# Patient Record
Sex: Female | Born: 1970 | Race: White | Hispanic: No | Marital: Married | Smoking: Never smoker
Health system: Southern US, Community
[De-identification: ages and names within clinical notes are randomized; demographics above are authoritative.]

## PROBLEM LIST (undated history)

## (undated) DIAGNOSIS — Z309 Encounter for contraceptive management, unspecified: Secondary | ICD-10-CM

## (undated) DIAGNOSIS — K649 Unspecified hemorrhoids: Secondary | ICD-10-CM

## (undated) DIAGNOSIS — F419 Anxiety disorder, unspecified: Secondary | ICD-10-CM

## (undated) DIAGNOSIS — J329 Chronic sinusitis, unspecified: Secondary | ICD-10-CM

## (undated) DIAGNOSIS — E079 Disorder of thyroid, unspecified: Secondary | ICD-10-CM

## (undated) DIAGNOSIS — E039 Hypothyroidism, unspecified: Secondary | ICD-10-CM

## (undated) HISTORY — DX: Chronic sinusitis, unspecified: J32.9

## (undated) HISTORY — DX: Encounter for contraceptive management, unspecified: Z30.9

## (undated) HISTORY — DX: Unspecified hemorrhoids: K64.9

## (undated) HISTORY — DX: Anxiety disorder, unspecified: F41.9

---

## 1998-04-12 ENCOUNTER — Other Ambulatory Visit: Admission: RE | Admit: 1998-04-12 | Discharge: 1998-04-12 | Payer: Self-pay | Admitting: Obstetrics and Gynecology

## 1999-04-25 ENCOUNTER — Other Ambulatory Visit: Admission: RE | Admit: 1999-04-25 | Discharge: 1999-04-25 | Payer: Self-pay | Admitting: Obstetrics and Gynecology

## 2000-11-07 ENCOUNTER — Other Ambulatory Visit: Admission: RE | Admit: 2000-11-07 | Discharge: 2000-11-07 | Payer: Self-pay | Admitting: *Deleted

## 2001-03-18 ENCOUNTER — Encounter: Admission: RE | Admit: 2001-03-18 | Discharge: 2001-06-03 | Payer: Self-pay | Admitting: Gynecology

## 2001-06-01 ENCOUNTER — Inpatient Hospital Stay (HOSPITAL_COMMUNITY): Admission: AD | Admit: 2001-06-01 | Discharge: 2001-06-04 | Payer: Self-pay | Admitting: *Deleted

## 2001-06-01 ENCOUNTER — Encounter (INDEPENDENT_AMBULATORY_CARE_PROVIDER_SITE_OTHER): Payer: Self-pay | Admitting: Specialist

## 2001-07-08 ENCOUNTER — Other Ambulatory Visit: Admission: RE | Admit: 2001-07-08 | Discharge: 2001-07-08 | Payer: Self-pay | Admitting: *Deleted

## 2002-07-14 ENCOUNTER — Other Ambulatory Visit: Admission: RE | Admit: 2002-07-14 | Discharge: 2002-07-14 | Payer: Self-pay | Admitting: *Deleted

## 2003-07-18 ENCOUNTER — Other Ambulatory Visit: Admission: RE | Admit: 2003-07-18 | Discharge: 2003-07-18 | Payer: Self-pay | Admitting: Gynecology

## 2004-07-18 ENCOUNTER — Other Ambulatory Visit: Admission: RE | Admit: 2004-07-18 | Discharge: 2004-07-18 | Payer: Self-pay | Admitting: Gynecology

## 2005-08-23 ENCOUNTER — Other Ambulatory Visit: Admission: RE | Admit: 2005-08-23 | Discharge: 2005-08-23 | Payer: Self-pay | Admitting: Gynecology

## 2006-08-09 ENCOUNTER — Emergency Department (HOSPITAL_COMMUNITY): Admission: EM | Admit: 2006-08-09 | Discharge: 2006-08-09 | Payer: Self-pay | Admitting: Family Medicine

## 2006-08-29 ENCOUNTER — Other Ambulatory Visit: Admission: RE | Admit: 2006-08-29 | Discharge: 2006-08-29 | Payer: Self-pay | Admitting: Gynecology

## 2007-07-11 ENCOUNTER — Inpatient Hospital Stay (HOSPITAL_COMMUNITY): Admission: RE | Admit: 2007-07-11 | Discharge: 2007-07-13 | Payer: Self-pay | Admitting: Obstetrics and Gynecology

## 2007-09-17 ENCOUNTER — Other Ambulatory Visit: Admission: RE | Admit: 2007-09-17 | Discharge: 2007-09-17 | Payer: Self-pay | Admitting: Obstetrics and Gynecology

## 2009-01-04 ENCOUNTER — Other Ambulatory Visit: Admission: RE | Admit: 2009-01-04 | Discharge: 2009-01-04 | Payer: Self-pay | Admitting: Obstetrics and Gynecology

## 2010-04-24 ENCOUNTER — Other Ambulatory Visit: Admission: RE | Admit: 2010-04-24 | Discharge: 2010-04-24 | Payer: Self-pay | Admitting: Obstetrics and Gynecology

## 2010-12-13 ENCOUNTER — Other Ambulatory Visit: Payer: Self-pay | Admitting: Obstetrics and Gynecology

## 2010-12-13 ENCOUNTER — Other Ambulatory Visit (HOSPITAL_COMMUNITY)
Admission: RE | Admit: 2010-12-13 | Discharge: 2010-12-13 | Disposition: A | Discharge status: Home / Self Care | Payer: BC Managed Care – PPO | Source: Ambulatory Visit | Attending: Obstetrics and Gynecology | Admitting: Obstetrics and Gynecology

## 2010-12-13 DIAGNOSIS — Z113 Encounter for screening for infections with a predominantly sexual mode of transmission: Secondary | ICD-10-CM | POA: Insufficient documentation

## 2010-12-13 DIAGNOSIS — Z01419 Encounter for gynecological examination (general) (routine) without abnormal findings: Secondary | ICD-10-CM | POA: Insufficient documentation

## 2011-03-19 NOTE — H&P (Signed)
NAMEMAILE, LINFORD                ACCOUNT NO.:  [id number]   MEDICAL RECORD NO.:  [id number]          PATIENT TYPE:  INP   LOCATION:  NA                            [facility]  [facility]   PHYSICIAN:  Tilda Burrow, M.D. DATE OF BIRTH:  10-Dec-1970   DATE OF ADMISSION:  DATE OF DISCHARGE:                              HISTORY & PHYSICAL   HISTORY OF PRESENT ILLNESS:  This is a 40 year old female, gravida 4,  para 1, AB 2, due July 20, 2007 by menstrual history, September 14  by 21-week ultrasound.  She is admitted at 38 weeks 6 days for repeat  cesarean section.  Natsuko had a prior cesarean section for breech  presentation discovered in labor.  She had reached 5-6 cm dilated when  breech was identified.  We discussed the option of evac versus  proceeding with cesarean section.  This is her final pregnancy and after  discussion of pros and cons with risks/benefits ratio, we decided on C-  section.  She plans to have her tubes tied in six months once the baby  is past six months age.   PAST MEDICAL HISTORY:  Hypothyroidism, stable times years.   PAST SURGICAL HISTORY:  Cesarean section x1.   ALLERGIES:  None.   MEDICATIONS:  Levoxyl 50 mcg p.o. daily.   ACTIVITY:  Cigarettes, alcohol, recreational drugs denied.   PHYSICAL EXAMINATION:  Reveals a petite, Caucasian female.  VITAL SIGNS:  Height 5 feet 2 inches, weight 162, which is a 25-pound  weight gain.  ABDOMEN:  Fundal height 39 cm, estimated fetal weight 7-1/2 pounds.  CERVIX:  Fingertip, long, -1, vertex.   Blood type O negative, RhoGAM administered at 28 weeks.  Rubella immune.  Hepatitis, HIV, RPR, GC, Chlamydia all negative.  Pap smear within  normal limits in September 2007.  MSAP declined.  Glucose tolerance test  175 mg% at one hour with a normal 3-hour test.  She had no glucosuria in  the third trimester.  Her partner, Lorin Picket, wants to be at the delivery,  wants to take pictures when permissible.  I should plan a  spinal  anesthesia.  She will take the baby to [facility].  She  plans to breast feed with tubal at six months.   PLAN:  Repeat C-section July 11, 2007.   ADDENDUM:  The patient wants to see a lactation consultant while in the  hospital.      Tilda Burrow, M.D.  Electronically Signed     JVF/MEDQ  D:  07/07/2007  T:  07/08/2007  Job:  [id number]   cc:   [facility]   [facility]

## 2011-03-19 NOTE — Op Note (Signed)
NAMESWAYZE, KOZUCH                ACCOUNT NO.:  [id number]   MEDICAL RECORD NO.:  [id number]          PATIENT TYPE:  INP   LOCATION:  NA                            [facility]  [facility]   PHYSICIAN:  Tilda Burrow, M.D. DATE OF BIRTH:  [birth year]   DATE OF PROCEDURE:  DATE OF DISCHARGE:                               OPERATIVE REPORT   PREOPERATIVE DIAGNOSIS:  Pregnancy, 39 weeks, repeat cesarean section.   POSTOPERATIVE DIAGNOSIS:  Pregnancy, 39 weeks, repeat cesarean section.   PROCEDURE:  Repeat low transverse cervical cesarean section.   SURGEON:  Tilda Burrow, M.D.   ASSISTANT:  Lesly Dukes, M.D.   ANESTHESIA:  Spinal.   COMPLICATIONS:  None.   FINDINGS:  A healthy female infant, 7 pounds, 7 ounces.  Apgars 9 and 9.   INDICATIONS:  A 40 year old repeat cesarean section patient at term, not  for trial of labor.  Plans tubal ligation in six months.   DESCRIPTION OF PROCEDURE:  Patient was taken to the operating room,  prepped and draped for lower abdominal surgery.  A Pfannenstiel incision  repeated with excision of the old cicatrix with a width of approximately  1 inch of skin and fatty tissue removed.  The underlying tissues were  cauterized as necessary for hemostasis and fascia opened transversely.  The peritoneal cavity was opened cephalad and caudad, with bladder flap  developed on the lower uterine segment.  A transverse nick made in the  uterus and extended laterally using index finger traction, fundal  pressure applied, and baby expelled easily.  Apgars of 9 and 9 assigned.  The cord was clamped.  The placenta delivered [facility] presentation in  response to uterine massage.  The uterus was irrigated with saline  solution, closed with a single layer of running, locking, closing with 0  Vicryl with hemostasis adequate after one transverse incision on the  upper aspects of the incision.  Then 2-0 chromic closure of the bladder  flap followed, then 2-0 chromic  closure of the peritoneum,  reapproximation of the rectus muscles with 0 chromic, and then the  fascia  closed with 0 Vicryl.  The subcu space was reapproximated with  interrupted subcu 2-0 plain and staple closure of the skin completed the  procedure.  Sponge and needle counts correct.  Patient tolerated the  procedure well, went to the recovery room in good condition.      Tilda Burrow, M.D.  Electronically Signed     JVF/MEDQ  D:  07/11/2007  T:  07/11/2007  Job:  [id number]

## 2011-03-19 NOTE — Discharge Summary (Signed)
Tami White, Tami White                ACCOUNT NO.:  [id number]   MEDICAL RECORD NO.:  [id number]          PATIENT TYPE:  INP   LOCATION:  9109                          [facility]ILITY[facility]  [facility]   PHYSICIAN:  Tilda Burrow, M.D. DATE OF BIRTH:  [birth year]   DATE OF ADMISSION:  07/11/2007  DATE OF DISCHARGE:  07/13/2007                               DISCHARGE SUMMARY   ADMISSION DIAGNOSES:  1. Pregnancy 39 weeks.  2. Repeat C section.  3. Not for trial of labor.  4. Rh negative maternal blood type.   DISCHARGE DIAGNOSES:  1. Pregnancy 39 weeks, delivered.  2. Repeat C section.  3. Not for trial of labor.  4. Rh negative mother.   PROCEDURE:  Repeat low transverse cervical cesarean section July 11, 2007, Tilda Burrow, M.D., RhoGAM administration July 12, 2007  under repeat C section delivering a 7 pound 7 ounce female, Apgar's 9  and 9.   DISCHARGE MEDICATIONS:  1. Percocet 5/325, 20 tablets 1 q.4h p.r.n. pain.  2. Motrin 600 mg q.6h p.r.n. cramps x30 tablets, refill x1.  3. Stool softener twice daily.  4. Colace x30 days.  5. Prenatal vitamins continued daily.   FOLLOWUP:  3 days for staple removal.   FUTURE CONTRACEPTION:  Undecided until 6 months when tubal planned.   HOSPITAL COURSE:  This 40 year old, multipara was admitted for repeat C  section on Saturday, July 11, 2007 and had a 7 pound 7 ounce (3390  gram) female, Apgar's 9 and 9, delivered without difficulty with  uncomplicated surgical course. Postoperatively she had a hemoglobin of  10 compared to a hemoglobin of 13  preprocedure. She had RhoGAM administered the first postoperative day,  tolerated a regular diet the first postoperative day and was stable for  discharge on the second postoperative day for followup in our office for  staple removal in 3 days. Routine post surgical instructions reviewed.      Tilda Burrow, M.D.  Electronically Signed     JVF/MEDQ  D:  07/13/2007  T:  07/13/2007   Job:  [id number]

## 2011-03-19 NOTE — H&P (Signed)
Tami White, Tami White                ACCOUNT NO.:  [id number]   MEDICAL RECORD NO.:  [id number]          PATIENT TYPE:  INP   LOCATION:  NA                            [facility]   PHYSICIAN:  Tilda Burrow, M.D. DATE OF BIRTH:  Sep 20, 1971   DATE OF ADMISSION:  07/11/2007  DATE OF DISCHARGE:                              HISTORY & PHYSICAL   ADMITTING DIAGNOSES:  Pregnancy 39 weeks' gestation.  Repeat cesarean section, not for trial of labor scheduled for Saturday,  September 6, at 7:30 a.m.   HISTORY OF PRESENT ILLNESS:  This 40 year old female gravida 4, para 1,  AB 2, last menstrual period October 12, 2006, placing [facility] July 20, 2007, with ultrasound [facility] of July 19, 2007, is admitted at 39 weeks  by consensus criteria.  She is scheduled for repeat cesarean section.  Pregnancy has been followed through our office through 13 prenatal  visits with appropriate weight gain and fundal height growth.  She has  been considering vaginal birth after cesarean section but after  counseling and discussion of pros and cons and relative risks and  benefits and insistence that she would need to review and sign a consent  form, she has decided to proceed with repeat cesarean section.  She does  not plan permanent contraception decision at this time but wishes tubal  ligation when the baby is 51 months old with interval contraception  undecided at this time, likely to be birth control pills.   PAST MEDICAL HISTORY:  Hypothyroidism with medications Levoxyl 50 mcg  p.o. daily.   SURGICAL HISTORY:  Cesarean section x1.   MEDICATIONS:  Levoxyl 50 mcg daily.   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   OB HISTORY:  Notable for gravida 4, para 1-0-2-1 with breech infant  identified at 7 cm when cesarean section performed in 2002, delivered at  [facility] by Dr. Farrel Gobble.   PRENATAL LABS:  Blood type O negative, RhoGAM administered, rubella  immune, the present hemoglobin 13, hematocrit 35.  Hepatitis, HIV, RPR,  GC, and Chlamydia all negative.  Pap smear within normal limits.  Group  B strep not done due to cesarean planned.  Glucose tolerance test 175  mg/% at 1 hour normal, 3 hour testing.  No glucosuria in the 3rd  trimester.   PHYSICAL EXAM:  Healthy-appearing, Caucasian female.  Height 5 feet 2 inches, weight 162 which is a 25 pound weight gain.  Fundal height 39 cm, vertex presentation with lower abdominal scar, well-  delineated and will be excised laterally.   PLAN:  Repeat cesarean section July 11, 2007.      Tilda Burrow, M.D.  Electronically Signed     JVF/MEDQ  D:  07/09/2007  T:  07/09/2007  Job:  [id number]   cc:   [facility]

## 2011-03-22 NOTE — Discharge Summary (Signed)
[facility]  Patient:    Tami White, Tami White                         MRN: [id number] Adm. Date:  [id number] Disc. Date: [id number] Attending:  Douglass Rivers Dictator:   Antony Contras, [facility]                           Discharge Summary  DISCHARGE DIAGNOSES:          1. Intrauterine pregnancy at term.                               2. Breech presentation.  PROCEDURE:                    Low cervical transverse cesarean section.  HISTORY OF PRESENT ILLNESS:   The patient is a 40 year old, gravida 2, para 0-0-1-0, LMP August 17, 2000, [facility] June 02, 2001. Prenatal risk factors include a history of hypothyroidism, HSV, glucose intolerance.  PRENATAL LABORATORY DATA:     Blood type O negative, antibody screen negative. RPR, HBsAG, HIV nonreactive. Rubella immune.  HOSPITAL COURSE/TREATMENT:    The patient was admitted on June 01, 2001 with spontaneous onset of labor and SROM. She was found to be in breech presentation. She was taken to the OR where primary cesarean section was performed by Dr. Douglass Rivers under spinal anesthesia. The patient was delivered of a viable female infant, Apgars 8/9, birth weight 7 pounds 5 ounces. There was a cord thrombus noted, otherwise normal tubes, uterus, and ovaries.  POSTOPERATIVE COURSE:         The patient remained afebrile and had no difficulty voiding. She did have some itching postoperatively. She did require some assistance with breast-feeding. Baby was Rh negative so she was not a RhoGAM candidate.  LABORATORIES:                 CBC: Hematocrit 30.9, hemoglobin 10.9, WBCs 14.0, platelets 172,000.  DISPOSITION:                  The patient is to follow up in six weeks, continue prenatal vitamins and iron, and Motrin/Tylox for pain. DD:  06/19/01 TD:  06/20/01 Job: [id number]

## 2011-03-22 NOTE — Op Note (Signed)
[facility]  Patient:    Tami White, Tami White                         MRN: [id number] Proc. Date: 06/01/01 Attending:  Douglass Rivers, M.D.                           Operative Report  PREOPERATIVE DIAGNOSIS:       Breech in labor.  POSTOPERATIVE DIAGNOSIS:      Breech in labor.  PROCEDURE:                    Primary cesarean section low flap transverse.  SURGEON:                      Douglass Rivers, M.D.  ASSISTANTHaydee Salter.  ANESTHESIA:                   Spinal.  ESTIMATED BLOOD LOSS:         500 cc.  FINDINGS:                     Viable female infant in the frank breech presentation, clear amniotic fluid. Apgars were 8/9. Birth weight was 7 pounds 5 ounces. There was a cord thrombus noted. Otherwise, normal uterus, tubes, and ovaries.  COMPLICATIONS:                None.  PATHOLOGY:                    Placenta.  DESCRIPTION OF PROCEDURE:     The patient was taken to the operating room. Spinal anesthesia was induced and she was placed in the supine position with a left lateral displacement. She was prepped and draped in the usual sterile fashion. After adequate anesthesia was assured, a Pfannenstiel skin incision was made with the scalpel and carried through to the underlying layer of fascia with electrocautery. The fascia was scored in the midline. The incision was extended laterally with Mayo scissors. The inferior aspect of the fascial incision was grasped with Kochers and the underlying rectus muscles were dissected off by blunt and sharp dissection. In a similar fashion, the superior aspect of the incision was grasped with Kochers and the underlying rectus muscles were dissected off. The rectus muscles were separated in the midline. The peritoneum was identified and entered bluntly. The peritoneal incision was then extended superiorly and inferiorly with good visualization of underlying bowel and bladder. The orientation  of the uterus was confirmed. The bladder blade was inserted. The vesicouterine peritoneum was identified, tented up, and entered sharply with the Metzenbaum scissors. The incision was extended laterally. The bladder flap was created digitally. The bladder blade was then reinserted in the lower uterine segment was incised in the transverse fashion with the scalpel. The infant was delivered from the frank breech presentation with the usual maneuvers and the head was delivered with the aid of Smellie-Veit maneuver. The cord was cut and clamped and the infant was handed off to the awaiting pediatricians. Cord bloods were obtained. A cord thrombus was noted. The placenta was allowed to separate naturally. The uterus was cleared of all clots and debris. The uterine incision was repaired with a running locked layer of 0 chromic and  then imbricated with a second suture of 0 Vicryl. The incision was noted to be hemostatic. The gutters were cleared of all clots and debris. The adnexa were inspected and noted to be normal. There was no bleeding noted under the bladder flap or the peritoneum, muscles, and fascia. The fascia was then closed with 0 Vicryl in a running fashion. The subcutaneous was irrigated. The skin was closed with staples. The patient tolerated the procedure well with sponge, lap, and needle counts correct x 2. She was given Ancef intraoperatively and transferred to the PACU in stable condition. DD:  06/01/01 TD:  06/01/01 Job: [id number]

## 2011-08-02 ENCOUNTER — Other Ambulatory Visit: Payer: Self-pay | Admitting: Obstetrics & Gynecology

## 2011-08-02 ENCOUNTER — Other Ambulatory Visit (HOSPITAL_COMMUNITY)
Admission: RE | Admit: 2011-08-02 | Discharge: 2011-08-02 | Disposition: A | Discharge status: Home / Self Care | Payer: BC Managed Care – PPO | Source: Ambulatory Visit | Attending: Obstetrics & Gynecology | Admitting: Obstetrics & Gynecology

## 2011-08-02 DIAGNOSIS — Z01419 Encounter for gynecological examination (general) (routine) without abnormal findings: Secondary | ICD-10-CM | POA: Insufficient documentation

## 2011-08-16 LAB — COMPREHENSIVE METABOLIC PANEL
AST: 19
Albumin: 2.6 — ABNORMAL LOW
Calcium: 9.3
Chloride: 105
Creatinine, Ser: 0.46
GFR calc Af Amer: >60
Total Bilirubin: 0.4

## 2011-08-16 LAB — RH IMMUNE GLOB WKUP(>/=20WKS)(NOT WOMEN'S HOSP): Fetal Screen: NEGATIVE

## 2011-08-16 LAB — CBC
Hemoglobin: 10.6 — ABNORMAL LOW
MCHC: 34.9
MCV: 86.2
Platelets: 159
Platelets: 199
RDW: 13.7
WBC: 10.7 — ABNORMAL HIGH

## 2011-08-16 LAB — TYPE AND SCREEN
ABO/RH(D): O NEG
Antibody Screen: NEGATIVE

## 2011-08-16 LAB — DIFFERENTIAL
Basophils Absolute: 0
Eosinophils Relative: 0
Lymphocytes Relative: 19
Lymphs Abs: 2.1
Neutro Abs: 8 — ABNORMAL HIGH
Neutrophils Relative %: 75

## 2011-08-16 LAB — RPR: RPR Ser Ql: NONREACTIVE

## 2011-08-16 LAB — ABO/RH: ABO/RH(D): O NEG

## 2012-08-04 ENCOUNTER — Other Ambulatory Visit: Payer: Self-pay | Admitting: Obstetrics & Gynecology

## 2012-10-20 ENCOUNTER — Other Ambulatory Visit (HOSPITAL_COMMUNITY)
Admission: RE | Admit: 2012-10-20 | Discharge: 2012-10-20 | Disposition: A | Discharge status: Home / Self Care | Payer: BC Managed Care – PPO | Source: Ambulatory Visit | Attending: Obstetrics and Gynecology | Admitting: Obstetrics and Gynecology

## 2012-10-20 ENCOUNTER — Other Ambulatory Visit: Payer: Self-pay | Admitting: Adult Health

## 2012-10-20 DIAGNOSIS — Z1151 Encounter for screening for human papillomavirus (HPV): Secondary | ICD-10-CM | POA: Insufficient documentation

## 2012-10-20 DIAGNOSIS — Z01419 Encounter for gynecological examination (general) (routine) without abnormal findings: Secondary | ICD-10-CM | POA: Insufficient documentation

## 2013-09-03 ENCOUNTER — Other Ambulatory Visit: Payer: Self-pay | Admitting: *Deleted

## 2013-09-03 MED ORDER — NORETHINDRONE 0.35 MG PO TABS: 1.0000 | ORAL_TABLET | Freq: Every day | ORAL | Status: DC

## 2013-10-10 ENCOUNTER — Emergency Department (INDEPENDENT_AMBULATORY_CARE_PROVIDER_SITE_OTHER)
Admission: EM | Admit: 2013-10-10 | Discharge: 2013-10-10 | Disposition: A | Discharge status: Home / Self Care | Payer: BC Managed Care – PPO | Source: Home / Self Care

## 2013-10-10 ENCOUNTER — Encounter (HOSPITAL_COMMUNITY): Payer: Self-pay | Admitting: Emergency Medicine

## 2013-10-10 DIAGNOSIS — J019 Acute sinusitis, unspecified: Secondary | ICD-10-CM

## 2013-10-10 DIAGNOSIS — J069 Acute upper respiratory infection, unspecified: Secondary | ICD-10-CM

## 2013-10-10 HISTORY — DX: Disorder of thyroid, unspecified: E07.9

## 2013-10-10 MED ORDER — FLUTICASONE PROPIONATE 50 MCG/ACT NA SUSP: 2.0000 | Freq: Every day | NASAL | Status: DC

## 2013-10-10 NOTE — ED Provider Notes (Signed)
Medical screening examination/treatment/procedure(s) were performed by resident physician or non-physician practitioner and as supervising physician I was immediately available for consultation/collaboration.   KINDL,JAMES DOUGLAS MD.   James D Kindl, MD 10/10/13 1411 

## 2013-10-10 NOTE — ED Provider Notes (Signed)
CSN: [id number]     Arrival date & time 10/10/13  1304 History   First MD Initiated Contact with Patient 10/10/13 1335     Chief Complaint  Patient presents with  . Cough   (Consider location/radiation/quality/duration/timing/severity/associated sxs/prior Treatment) HPI Comments: 42 year old female complaining of a cough for 3 days. It is associated with head pressure across the forehead and glabella. It is worse when supine. She has PND and clears her throat frequently. Denies fever, chills or GI symptoms.   Past Medical History  Diagnosis Date  . Thyroid disease     Hypothyroidism   Past Surgical History  Procedure Laterality Date  . Cesarean section     No family history on file. History  Substance Use Topics  . Smoking status: Never Smoker   . Smokeless tobacco: Not on file  . Alcohol Use: No   OB History   Grav Para Term Preterm Abortions TAB SAB Ect Mult Living                 Review of Systems  Constitutional: Negative for fever, chills, activity change, appetite change and fatigue.  HENT: Positive for congestion and postnasal drip. Negative for facial swelling, rhinorrhea and sore throat.   Eyes: Negative.   Respiratory: Positive for cough. Negative for chest tightness, shortness of breath and stridor.   Cardiovascular: Negative.   Gastrointestinal: Negative.   Genitourinary: Negative.   Musculoskeletal: Negative for neck pain and neck stiffness.  Skin: Negative for pallor and rash.  Neurological: Negative.     Allergies  Review of patient's allergies indicates no known allergies.  Home Medications   Current Outpatient Rx  Name  Route  Sig  Dispense  Refill  . LEVOTHYROXINE SODIUM PO   Oral   Take by mouth.         . norethindrone (NORA-BE) 0.35 MG tablet   Oral   Take 1 tablet (0.35 mg total) by mouth daily.   1 Package   11   . SERTRALINE HCL PO   Oral   Take by mouth.         . fluticasone (FLONASE) 50 MCG/ACT nasal spray   Each Nare   Place 2 sprays into both nostrils daily.   16 g   2    BP 132/81  Pulse 79  Temp(Src) 98.2 F (36.8 C) (Oral)  Resp 17  SpO2 96%  LMP 09/15/2013 Physical Exam  Nursing note and vitals reviewed. Constitutional: She is oriented to person, place, and time. She appears well-developed and well-nourished. No distress.  HENT:  Bilateral TMs are normal Oropharynx with erythema and cobblestoning and red streaking. No exudates.  Eyes: Conjunctivae and EOM are normal.  Neck: Normal range of motion. Neck supple.  Cardiovascular: Normal rate, regular rhythm, normal heart sounds and intact distal pulses.   Pulmonary/Chest: Effort normal and breath sounds normal. No respiratory distress.  Musculoskeletal: Normal range of motion. She exhibits no edema.  Lymphadenopathy:    She has no cervical adenopathy.  Neurological: She is alert and oriented to person, place, and time.  Skin: Skin is warm and dry. No rash noted.  Psychiatric: She has a normal mood and affect.    ED Course  Procedures (including critical care time) Labs Review Labs Reviewed - No data to display Imaging Review No results found.    MDM   1. URI (upper respiratory infection)   2. Acute rhinosinusitis      Alka Seltzer Plus and Robitussin DM Plenty of  fluids Tylenol or Motrin prn.     Hayden Rasmussen, NP 10/10/13 1407

## 2013-10-10 NOTE — ED Notes (Signed)
C/O raspy cough onset Thursday..  Feels need to bring up sputum, but unable to do so.  Now also starting with head pressure.  Has been using cough syrup.

## 2013-11-09 ENCOUNTER — Other Ambulatory Visit: Payer: Self-pay | Admitting: *Deleted

## 2013-11-09 MED ORDER — LEVOTHYROXINE SODIUM 100 MCG PO TABS: 100.0000 ug | ORAL_TABLET | Freq: Every day | ORAL | Status: DC

## 2013-11-09 MED ORDER — SERTRALINE HCL 25 MG PO TABS: 25.0000 mg | ORAL_TABLET | Freq: Every day | ORAL | Status: DC

## 2013-11-09 NOTE — Telephone Encounter (Signed)
Refilled zoloft and levothyroxine

## 2013-11-23 ENCOUNTER — Encounter: Payer: Self-pay | Admitting: Adult Health

## 2013-11-23 ENCOUNTER — Ambulatory Visit (INDEPENDENT_AMBULATORY_CARE_PROVIDER_SITE_OTHER): Payer: BC Managed Care – PPO | Admitting: Adult Health

## 2013-11-23 VITALS — BP 100/60 | HR 78 | Ht 62.5 in | Wt 159.0 lb

## 2013-11-23 DIAGNOSIS — Z01419 Encounter for gynecological examination (general) (routine) without abnormal findings: Secondary | ICD-10-CM

## 2013-11-23 DIAGNOSIS — Z309 Encounter for contraceptive management, unspecified: Secondary | ICD-10-CM

## 2013-11-23 DIAGNOSIS — K649 Unspecified hemorrhoids: Secondary | ICD-10-CM

## 2013-11-23 DIAGNOSIS — J329 Chronic sinusitis, unspecified: Secondary | ICD-10-CM

## 2013-11-23 DIAGNOSIS — F419 Anxiety disorder, unspecified: Secondary | ICD-10-CM

## 2013-11-23 DIAGNOSIS — Z1212 Encounter for screening for malignant neoplasm of rectum: Secondary | ICD-10-CM

## 2013-11-23 DIAGNOSIS — E039 Hypothyroidism, unspecified: Secondary | ICD-10-CM | POA: Insufficient documentation

## 2013-11-23 HISTORY — DX: Encounter for contraceptive management, unspecified: Z30.9

## 2013-11-23 HISTORY — DX: Chronic sinusitis, unspecified: J32.9

## 2013-11-23 LAB — HEMOCCULT GUIAC POC 1CARD (OFFICE): FECAL OCCULT BLD: NEGATIVE

## 2013-11-23 MED ORDER — SERTRALINE HCL 25 MG PO TABS: 25.0000 mg | ORAL_TABLET | Freq: Every day | ORAL | Status: DC

## 2013-11-23 MED ORDER — LEVOTHYROXINE SODIUM 100 MCG PO TABS: 100.0000 ug | ORAL_TABLET | Freq: Every day | ORAL | Status: DC

## 2013-11-23 MED ORDER — AZITHROMYCIN 250 MG PO TABS: ORAL_TABLET | ORAL | Status: DC

## 2013-11-23 MED ORDER — NORETHINDRONE 0.35 MG PO TABS: 1.0000 | ORAL_TABLET | Freq: Every day | ORAL | Status: DC

## 2013-11-23 NOTE — Progress Notes (Signed)
Patient ID: Tami White, female   DOB: 02/23/1971, 43 y.o.   MRN: [id number] History of Present Illness: Tami White is a 43 year old white female, married in for physical.Had normal pap with negative HPV 10/20/12.Complians of headache and pressure ?sinus infection and cough.Got flu shot.   Current Medications, Allergies, Past Medical History, Past Surgical History, Family History and Social History were reviewed in [facility] electronic medical record.     Review of Systems: Patient denies any blurred vision, shortness of breath, chest pain, abdominal pain, problems with bowel movements, urination, or intercourse. No joint pain or mood swings. See HPI for positives.    Physical Exam:BP 100/60  Pulse 78  Ht 5' 2.5" (1.588 m)  Wt 159 lb (72.122 kg)  BMI 28.60 kg/m2  LMP 11/02/2013 General:  Well developed, well nourished, no acute distress Skin:  Warm and dry, +tenderness right frontal and maxillary sinus Neck:  Midline trachea, normal thyroid Lungs; Clear to auscultation bilaterally Breast:  No dominant palpable mass, retraction, or nipple discharge Cardiovascular: Regular rate and rhythm Abdomen:  Soft, non tender, no hepatosplenomegaly Pelvic:  External genitalia is normal in appearance.  The vagina is normal in appearance.  The cervix is smooth.  Uterus is felt to be normal size, shape, and contour.  No adnexal masses or tenderness noted. Rectal: Good sphincter tone, no polyps,interanl hemorrhoids felt and has external hemorrhoid tag.  Hemoccult negative. Extremities:  No swelling or varicosities noted Psych:  No mood changes, alert and cooperative,seems happy   Impression: Yearly gyn exam no pap Contraceptive management Sinus infection Hypothyroid Hemorrhoids Anxiety  Plan: Physical and pap in 1 year Mammogram now and yearly Refilled micronor x 1 year Refilled levothyroxine 100 mcg x 1 year Refilled zoloft x 1 year Rx Z pack Review handout   Use Anusol or preparation  H

## 2013-11-23 NOTE — Patient Instructions (Signed)
Physical in 1 year Mammogram now and yearly [phone] Call prn problems

## 2014-01-19 ENCOUNTER — Telehealth: Payer: Self-pay | Admitting: Adult Health

## 2014-01-19 MED ORDER — ALPRAZOLAM 0.5 MG PO TABS: 0.5000 mg | ORAL_TABLET | Freq: Every evening | ORAL | Status: DC | PRN

## 2014-01-19 NOTE — Telephone Encounter (Signed)
Pt having stress at home can;t sleep at time will rx xanax has used in past

## 2014-03-21 ENCOUNTER — Other Ambulatory Visit: Payer: Self-pay | Admitting: *Deleted

## 2014-03-21 MED ORDER — ALPRAZOLAM 0.5 MG PO TABS: 0.5000 mg | ORAL_TABLET | Freq: Every evening | ORAL | Status: DC | PRN

## 2014-06-15 ENCOUNTER — Other Ambulatory Visit: Payer: Self-pay | Admitting: Obstetrics and Gynecology

## 2014-06-15 DIAGNOSIS — Z139 Encounter for screening, unspecified: Secondary | ICD-10-CM

## 2014-06-16 ENCOUNTER — Ambulatory Visit (HOSPITAL_COMMUNITY)
Admission: RE | Admit: 2014-06-16 | Discharge: 2014-06-16 | Disposition: A | Discharge status: Home / Self Care | Payer: BC Managed Care – PPO | Source: Ambulatory Visit | Attending: Obstetrics and Gynecology | Admitting: Obstetrics and Gynecology

## 2014-06-16 DIAGNOSIS — Z1231 Encounter for screening mammogram for malignant neoplasm of breast: Secondary | ICD-10-CM | POA: Insufficient documentation

## 2014-06-16 DIAGNOSIS — Z139 Encounter for screening, unspecified: Secondary | ICD-10-CM

## 2014-09-10 ENCOUNTER — Emergency Department (INDEPENDENT_AMBULATORY_CARE_PROVIDER_SITE_OTHER)
Admission: EM | Admit: 2014-09-10 | Discharge: 2014-09-10 | Disposition: A | Discharge status: Home / Self Care | Payer: BC Managed Care – PPO | Source: Home / Self Care | Attending: Family Medicine | Admitting: Family Medicine

## 2014-09-10 ENCOUNTER — Encounter (HOSPITAL_COMMUNITY): Payer: Self-pay | Admitting: *Deleted

## 2014-09-10 DIAGNOSIS — J039 Acute tonsillitis, unspecified: Secondary | ICD-10-CM

## 2014-09-10 MED ORDER — AMOXICILLIN-POT CLAVULANATE 875-125 MG PO TABS: 1.0000 | ORAL_TABLET | Freq: Two times a day (BID) | ORAL | Status: DC

## 2014-09-10 NOTE — ED Notes (Signed)
Pt  Reports   Symptoms  Of  sorethroat body aches   Chills    And  Sensation of  Swelling  l  Side  Of  Her  Throat          She  Reports symptoms    Started  yest am       She  Reports     Symptoms  Not   releived  By  OTC  meds

## 2014-09-10 NOTE — Discharge Instructions (Signed)
Thank you for coming in today. Take Augmentin twice daily for one week. Use Tylenol or ibuprofen or Aleve for pain and fever control Come back as needed  Tonsillitis Tonsillitis is an infection of the throat that causes the tonsils to become red, tender, and swollen. Tonsils are collections of lymphoid tissue at the back of the throat. Each tonsil has crevices (crypts). Tonsils help fight nose and throat infections and keep infection from spreading to other parts of the body for the first 18 months of life.  CAUSES Sudden (acute) tonsillitis is usually caused by infection with streptococcal bacteria. Long-lasting (chronic) tonsillitis occurs when the crypts of the tonsils become filled with pieces of food and bacteria, which makes it easy for the tonsils to become repeatedly infected. SYMPTOMS  Symptoms of tonsillitis include:  A sore throat, with possible difficulty swallowing.  White patches on the tonsils.  Fever.  Tiredness.  New episodes of snoring during sleep, when you did not snore before.  Small, foul-smelling, yellowish-white pieces of material (tonsilloliths) that you occasionally cough up or spit out. The tonsilloliths can also cause you to have bad breath. DIAGNOSIS Tonsillitis can be diagnosed through a physical exam. Diagnosis can be confirmed with the results of lab tests, including a throat culture. TREATMENT  The goals of tonsillitis treatment include the reduction of the severity and duration of symptoms and prevention of associated conditions. Symptoms of tonsillitis can be improved with the use of steroids to reduce the swelling. Tonsillitis caused by bacteria can be treated with antibiotic medicines. Usually, treatment with antibiotic medicines is started before the cause of the tonsillitis is known. However, if it is determined that the cause is not bacterial, antibiotic medicines will not treat the tonsillitis. If attacks of tonsillitis are severe and frequent, your  health care provider may recommend surgery to remove the tonsils (tonsillectomy). HOME CARE INSTRUCTIONS   Rest as much as possible and get plenty of sleep.  Drink plenty of fluids. While the throat is very sore, eat soft foods or liquids, such as sherbet, soups, or instant breakfast drinks.  Eat frozen ice pops.  Gargle with a warm or cold liquid to help soothe the throat. Mix 1/4 teaspoon of salt and 1/4 teaspoon of baking soda in 8 oz of water. SEEK MEDICAL CARE IF:   Large, tender lumps develop in your neck.  A rash develops.  A green, yellow-brown, or bloody substance is coughed up.  You are unable to swallow liquids or food for 24 hours.  You notice that only one of the tonsils is swollen. SEEK IMMEDIATE MEDICAL CARE IF:   You develop any new symptoms such as vomiting, severe headache, stiff neck, chest pain, or trouble breathing or swallowing.  You have severe throat pain along with drooling or voice changes.  You have severe pain, unrelieved with recommended medications.  You are unable to fully open the mouth.  You develop redness, swelling, or severe pain anywhere in the neck.  You have a fever. MAKE SURE YOU:   Understand these instructions.  Will watch your condition.  Will get help right away if you are not doing well or get worse. Document Released: 07/31/2005 Document Revised: 03/07/2014 Document Reviewed: 04/09/2013 [facility] Patient Information 2015 [employer] [place]. This information is not intended to replace advice given to you by your health care provider. Make sure you discuss any questions you have with your health care provider.

## 2014-09-10 NOTE — ED Provider Notes (Signed)
Tami White is a 43 y.o. female who presents to Urgent Care today for Sore throat and body aches chills. No nausea vomiting diarrhea. No chest pain palpitations shortness of breath. Symptoms present for one day. Patient notes her throat pain is moderate and worse with swallowing. She feels well otherwise.   Past Medical History  Diagnosis Date  . Thyroid disease     Hypothyroidism  . Anxiety   . Hemorrhoids   . Contraceptive management 11/23/2013  . Sinus infection 11/23/2013   Past Surgical History  Procedure Laterality Date  . Cesarean section     History  Substance Use Topics  . Smoking status: Never Smoker   . Smokeless tobacco: Never Used  . Alcohol Use: No   ROS as above Medications: No current facility-administered medications for this encounter.   Current Outpatient Prescriptions  Medication Sig Dispense Refill  . ALPRAZolam (XANAX) 0.5 MG tablet Take 1 tablet (0.5 mg total) by mouth at bedtime as needed for anxiety. 30 tablet 0  . ALPRAZolam (XANAX) 0.5 MG tablet Take 1 tablet (0.5 mg total) by mouth at bedtime as needed for anxiety. 30 tablet 1  . amoxicillin-clavulanate (AUGMENTIN) 875-125 MG per tablet Take 1 tablet by mouth every 12 (twelve) hours. 14 tablet 0  . fluticasone (FLONASE) 50 MCG/ACT nasal spray Place 2 sprays into both nostrils daily. 16 g 2  . levothyroxine (SYNTHROID, LEVOTHROID) 100 MCG tablet Take 1 tablet (100 mcg total) by mouth daily before breakfast. 30 tablet 11  . norethindrone (NORA-BE) 0.35 MG tablet Take 1 tablet (0.35 mg total) by mouth daily. 1 Package 11  . sertraline (ZOLOFT) 25 MG tablet Take 1 tablet (25 mg total) by mouth daily. 30 tablet 11   No Known Allergies   Exam:  BP 120/82 mmHg  Pulse 109  Temp(Src) 99.2 F (37.3 C) (Oral)  Resp 16  SpO2 98%  LMP 08/24/2014 Gen: Well NAD HEENT: EOMI,  MMM tonsils are enlarged and erythematous with exudates bilaterally. Tympanic membranes are normal appearing bilaterally. Lungs:  Normal work of breathing. CTABL Heart: RRR no MRG Abd: NABS, Soft. Nondistended, Nontender Exts: Brisk capillary refill, warm and well perfused.   No results found for this or any previous visit (from the past 24 hour(s)). No results found.  Assessment and Plan: 43 y.o. female with tonsillitis. Treatment with Augmentin  Discussed warning signs or symptoms. Please see discharge instructions. Patient expresses understanding.     Rodolph BongEvan S Bear Osten, MD 09/10/14 1145

## 2014-09-27 ENCOUNTER — Other Ambulatory Visit: Payer: Self-pay | Admitting: Adult Health

## 2015-02-18 ENCOUNTER — Other Ambulatory Visit: Payer: Self-pay | Admitting: Adult Health

## 2015-05-30 ENCOUNTER — Encounter: Payer: Self-pay | Admitting: Adult Health

## 2015-05-30 ENCOUNTER — Other Ambulatory Visit (HOSPITAL_COMMUNITY)
Admission: RE | Admit: 2015-05-30 | Discharge: 2015-05-30 | Disposition: A | Discharge status: Home / Self Care | Payer: BC Managed Care – PPO | Source: Ambulatory Visit | Attending: Adult Health | Admitting: Adult Health

## 2015-05-30 ENCOUNTER — Ambulatory Visit (INDEPENDENT_AMBULATORY_CARE_PROVIDER_SITE_OTHER): Payer: BC Managed Care – PPO | Admitting: Adult Health

## 2015-05-30 VITALS — BP 112/76 | HR 56 | Ht 62.5 in | Wt 162.0 lb

## 2015-05-30 DIAGNOSIS — Z1151 Encounter for screening for human papillomavirus (HPV): Secondary | ICD-10-CM | POA: Diagnosis present

## 2015-05-30 DIAGNOSIS — Z01419 Encounter for gynecological examination (general) (routine) without abnormal findings: Secondary | ICD-10-CM

## 2015-05-30 DIAGNOSIS — E039 Hypothyroidism, unspecified: Secondary | ICD-10-CM

## 2015-05-30 DIAGNOSIS — Z1212 Encounter for screening for malignant neoplasm of rectum: Secondary | ICD-10-CM | POA: Diagnosis not present

## 2015-05-30 LAB — HEMOCCULT GUIAC POC 1CARD (OFFICE): FECAL OCCULT BLD: NEGATIVE

## 2015-05-30 MED ORDER — NORETHINDRONE 0.35 MG PO TABS: 1.0000 | ORAL_TABLET | Freq: Every day | ORAL | Status: DC

## 2015-05-30 NOTE — Progress Notes (Signed)
Patient ID: Tami White, female   DOB: 02-Apr-1971, 44 y.o.   MRN: [id number] History of Present Illness:  Tami White is a 44 year old white female, married in for well woman gyn exam and pap.Last week had epigastric pain and diarrhea, but is fine now,she has been of her synthroid for 2-3 months,she just stopped when refills ran out and she says she is fine.She also stopped zoloft and says life is good.  Current Medications, Allergies, Past Medical History, Past Surgical History, Family History and Social History were reviewed in [employer] record.     Review of Systems:  Patient denies any headaches, hearing loss, fatigue, blurred vision, shortness of breath, chest pain, abdominal pain, problems with bowel movements, urination, or intercourse. No joint pain or mood swings.   Physical Exam:BP 112/76 mmHg  Pulse 56  Ht 5' 2.5" (1.588 m)  Wt 162 lb (73.483 kg)  BMI 29.14 kg/m2  LMP 05/16/2015 General:  Well developed, well nourished, no acute distress Skin:  Warm and dry Neck:  Midline trachea, normal thyroid, good ROM, no lymphadenopathy Lungs; Clear to auscultation bilaterally Breast:  No dominant palpable mass, retraction, or nipple discharge Cardiovascular: Regular rate and rhythm Abdomen:  Soft, non tender, no hepatosplenomegaly Pelvic:  External genitalia is normal in appearance, no lesions.  The vagina is normal in appearance. Urethra has no lesions or masses. The cervix is smooth, pap with HPV performed.  Uterus is felt to be normal size, shape, and contour.  No adnexal masses or tenderness noted.Bladder is non tender, no masses felt. Rectal: Good sphincter tone, no polyps, or hemorrhoids felt.  Hemoccult negative. Extremities/musculoskeletal:  No swelling or varicosities noted, no clubbing or cyanosis Psych:  No mood changes, alert and cooperative,seems happy If has reoccurrence of pain in abdomen to call.  Impression: Well woman gyn exam with  pap Hypothyroidism     Plan: Check CBC,CMP,TSH and lipids, will talk when labs back  Physical in 1 year,pap in 3 if normal with negative HPV Mammogram yearly due in August Refilled micronor x 1 year, take 1 daily disp 1 pack

## 2015-05-30 NOTE — Patient Instructions (Signed)
Physical in 1 year Mammogram yearly  

## 2015-05-31 ENCOUNTER — Telehealth: Payer: Self-pay | Admitting: Adult Health

## 2015-05-31 LAB — COMPREHENSIVE METABOLIC PANEL
A/G RATIO: 1.4 (ref 1.1–2.5)
ALBUMIN: 4.4 g/dL (ref 3.5–5.5)
ALK PHOS: 72 IU/L (ref 39–117)
ALT: 21 IU/L (ref 0–32)
AST: 20 IU/L (ref 0–40)
BILIRUBIN TOTAL: 0.3 mg/dL (ref 0.0–1.2)
BUN / CREAT RATIO: 13 (ref 9–23)
BUN: 10 mg/dL (ref 6–24)
CO2: 22 mmol/L (ref 18–29)
Calcium: 9.6 mg/dL (ref 8.7–10.2)
Chloride: 100 mmol/L (ref 97–108)
Creatinine, Ser: 0.78 mg/dL (ref 0.57–1.00)
GFR calc Af Amer: 108 mL/min/{1.73_m2} (ref >59)
GFR, EST NON AFRICAN AMERICAN: 94 mL/min/{1.73_m2} (ref >59)
GLUCOSE: 78 mg/dL (ref 65–99)
Globulin, Total: 3.1 g/dL (ref 1.5–4.5)
POTASSIUM: 4.5 mmol/L (ref 3.5–5.2)
SODIUM: 140 mmol/L (ref 134–144)
TOTAL PROTEIN: 7.5 g/dL (ref 6.0–8.5)

## 2015-05-31 LAB — LIPID PANEL
CHOL/HDL RATIO: 3.8 ratio (ref 0.0–4.4)
CHOLESTEROL TOTAL: 180 mg/dL (ref 100–199)
HDL: 47 mg/dL (ref >39)
LDL CALC: 104 mg/dL — AB (ref 0–99)
Triglycerides: 144 mg/dL (ref 0–149)
VLDL CHOLESTEROL CAL: 29 mg/dL (ref 5–40)

## 2015-05-31 LAB — CBC
HEMATOCRIT: 41.7 % (ref 34.0–46.6)
Hemoglobin: 14.5 g/dL (ref 11.1–15.9)
MCH: 28.5 pg (ref 26.6–33.0)
MCHC: 34.8 g/dL (ref 31.5–35.7)
MCV: 82 fL (ref 79–97)
PLATELETS: 256 10*3/uL (ref 150–379)
RBC: 5.08 x10E6/uL (ref 3.77–5.28)
RDW: 14 % (ref 12.3–15.4)
WBC: 9 10*3/uL (ref 3.4–10.8)

## 2015-05-31 LAB — TSH: TSH: 35.91 u[IU]/mL — ABNORMAL HIGH (ref 0.450–4.500)

## 2015-05-31 MED ORDER — LEVOTHYROXINE SODIUM 50 MCG PO TABS: 50.0000 ug | ORAL_TABLET | Freq: Every day | ORAL | Status: DC

## 2015-05-31 NOTE — Telephone Encounter (Signed)
Left message about labs and that TSH elevated, will RX synthroid 50 mcg with 2 refills then need to recheck TSH in about 8 weeks

## 2015-06-01 LAB — CYTOLOGY - PAP

## 2015-06-19 ENCOUNTER — Other Ambulatory Visit: Payer: Self-pay | Admitting: Adult Health

## 2015-06-19 DIAGNOSIS — Z1231 Encounter for screening mammogram for malignant neoplasm of breast: Secondary | ICD-10-CM

## 2015-06-22 ENCOUNTER — Ambulatory Visit (HOSPITAL_COMMUNITY)
Admission: RE | Admit: 2015-06-22 | Discharge: 2015-06-22 | Disposition: A | Discharge status: Home / Self Care | Payer: BC Managed Care – PPO | Source: Ambulatory Visit | Attending: Adult Health | Admitting: Adult Health

## 2015-06-22 DIAGNOSIS — Z1231 Encounter for screening mammogram for malignant neoplasm of breast: Secondary | ICD-10-CM

## 2015-08-05 ENCOUNTER — Other Ambulatory Visit: Payer: Self-pay | Admitting: Adult Health

## 2015-08-31 ENCOUNTER — Other Ambulatory Visit: Payer: BC Managed Care – PPO

## 2015-09-04 ENCOUNTER — Other Ambulatory Visit: Payer: BC Managed Care – PPO

## 2015-09-05 LAB — TSH: TSH: 8.54 u[IU]/mL — ABNORMAL HIGH (ref 0.450–4.500)

## 2015-09-07 ENCOUNTER — Telehealth: Payer: Self-pay | Admitting: Adult Health

## 2015-09-07 MED ORDER — LEVOTHYROXINE SODIUM 75 MCG PO TABS: 75.0000 ug | ORAL_TABLET | Freq: Every day | ORAL | Status: DC

## 2015-09-07 NOTE — Telephone Encounter (Signed)
Left message TSH 8.540 which is better but will increase synthroid to 75 mcg and recheck labs in 3 months

## 2015-12-23 ENCOUNTER — Other Ambulatory Visit: Payer: Self-pay | Admitting: Adult Health

## 2016-03-15 ENCOUNTER — Other Ambulatory Visit: Payer: Self-pay | Admitting: Adult Health

## 2016-04-16 ENCOUNTER — Other Ambulatory Visit: Payer: Self-pay | Admitting: Adult Health

## 2016-05-16 ENCOUNTER — Telehealth: Payer: Self-pay | Admitting: Adult Health

## 2016-05-16 DIAGNOSIS — E039 Hypothyroidism, unspecified: Secondary | ICD-10-CM

## 2016-05-16 NOTE — Telephone Encounter (Signed)
Spoke with pt. Pt ran out of thyroid med a while back. Pt feels like she needs to have lab repeated before any meds are ordered. Please advise. Thanks!! JSY

## 2016-05-17 NOTE — Telephone Encounter (Signed)
Spoke with pt letting her know to come get labs. Orders put in EPIC. Pt plans to get labs today. JSY

## 2016-05-17 NOTE — Addendum Note (Signed)
Addended by: Malachy MoodYOUNG, Naira Standiford S on: 05/17/2016 09:00 AM   Modules accepted: Orders

## 2016-05-18 LAB — TSH: TSH: 58.81 u[IU]/mL — AB (ref 0.450–4.500)

## 2016-05-18 LAB — T4, FREE: FREE T4: 0.69 ng/dL — AB (ref 0.82–1.77)

## 2016-05-20 MED ORDER — LEVOTHYROXINE SODIUM 75 MCG PO TABS: 75.0000 ug | ORAL_TABLET | Freq: Every day | ORAL | Status: DC

## 2016-05-20 NOTE — Addendum Note (Signed)
Addended by: Cyril MourningGRIFFIN, Scottlynn Lindell A on: 05/20/2016 09:46 PM   Modules accepted: Orders

## 2016-05-21 NOTE — Telephone Encounter (Signed)
Left message @ 8:58 am. JAG wants to know how long she was off of Synthroid. She refilled her last dose but if she has been off longer than a few weeks, she needs lower dose. Please have Kim review. Needs repeat labs in 8 weeks. JSY

## 2016-05-21 NOTE — Telephone Encounter (Signed)
Pt informed of elevated TSH, [facility], repeat TSH in 8 weeks.

## 2016-07-22 ENCOUNTER — Other Ambulatory Visit: Payer: Self-pay

## 2016-07-23 ENCOUNTER — Other Ambulatory Visit: Payer: BC Managed Care – PPO

## 2016-07-23 DIAGNOSIS — E039 Hypothyroidism, unspecified: Secondary | ICD-10-CM

## 2016-07-24 ENCOUNTER — Telehealth: Payer: Self-pay | Admitting: Adult Health

## 2016-07-24 LAB — TSH: TSH: 8.12 u[IU]/mL — AB (ref 0.450–4.500)

## 2016-07-24 MED ORDER — LEVOTHYROXINE SODIUM 88 MCG PO TABS: 88.0000 ug | ORAL_TABLET | Freq: Every day | ORAL | 2 refills | Status: DC

## 2016-07-24 NOTE — Telephone Encounter (Signed)
Left message that TSH was much better, it is 8.120, I increased synthroid to 88 mcg and sent rx to drug store and want to recheck labs in 8-10 weeks

## 2016-08-02 ENCOUNTER — Other Ambulatory Visit: Payer: Self-pay | Admitting: Adult Health

## 2016-09-02 ENCOUNTER — Other Ambulatory Visit: Payer: Self-pay | Admitting: Adult Health

## 2016-09-02 DIAGNOSIS — Z1231 Encounter for screening mammogram for malignant neoplasm of breast: Secondary | ICD-10-CM

## 2016-09-23 ENCOUNTER — Ambulatory Visit (HOSPITAL_COMMUNITY)
Admission: RE | Admit: 2016-09-23 | Discharge: 2016-09-23 | Disposition: A | Discharge status: Home / Self Care | Payer: BC Managed Care – PPO | Source: Ambulatory Visit | Attending: Adult Health | Admitting: Adult Health

## 2016-09-23 DIAGNOSIS — Z1231 Encounter for screening mammogram for malignant neoplasm of breast: Secondary | ICD-10-CM | POA: Diagnosis not present

## 2016-09-24 ENCOUNTER — Other Ambulatory Visit: Payer: Self-pay | Admitting: Adult Health

## 2016-11-23 ENCOUNTER — Other Ambulatory Visit: Payer: Self-pay | Admitting: Adult Health

## 2016-12-20 ENCOUNTER — Other Ambulatory Visit: Payer: Self-pay | Admitting: Adult Health

## 2016-12-25 ENCOUNTER — Telehealth: Payer: Self-pay | Admitting: Adult Health

## 2016-12-25 ENCOUNTER — Other Ambulatory Visit: Payer: BC Managed Care – PPO

## 2016-12-25 DIAGNOSIS — E039 Hypothyroidism, unspecified: Secondary | ICD-10-CM

## 2016-12-25 NOTE — Telephone Encounter (Signed)
Left message that I put order in for TSH

## 2016-12-26 ENCOUNTER — Telehealth: Payer: Self-pay | Admitting: Adult Health

## 2016-12-26 LAB — TSH: TSH: 3.33 u[IU]/mL (ref 0.450–4.500)

## 2016-12-26 MED ORDER — LEVOTHYROXINE SODIUM 88 MCG PO TABS: ORAL_TABLET | ORAL | 11 refills | Status: DC

## 2016-12-26 NOTE — Telephone Encounter (Signed)
Pt aware that TSH is good, will refill levothyroxine for 1 year

## 2017-01-17 ENCOUNTER — Other Ambulatory Visit: Payer: Self-pay | Admitting: Adult Health

## 2017-05-02 ENCOUNTER — Other Ambulatory Visit: Payer: Self-pay | Admitting: Adult Health

## 2017-05-23 ENCOUNTER — Telehealth: Payer: Self-pay | Admitting: Adult Health

## 2017-05-23 MED ORDER — METHYLPREDNISOLONE 4 MG PO TBPK: ORAL_TABLET | ORAL | 0 refills | Status: DC

## 2017-05-23 NOTE — Telephone Encounter (Signed)
Left message [facility]if not better Monday make appt

## 2017-06-02 ENCOUNTER — Other Ambulatory Visit: Payer: Self-pay | Admitting: Adult Health

## 2017-12-19 ENCOUNTER — Other Ambulatory Visit: Payer: Self-pay | Admitting: Adult Health

## 2018-02-12 ENCOUNTER — Other Ambulatory Visit: Payer: Self-pay | Admitting: Adult Health

## 2018-03-13 ENCOUNTER — Other Ambulatory Visit: Payer: Self-pay | Admitting: Adult Health

## 2018-04-30 ENCOUNTER — Other Ambulatory Visit: Payer: Self-pay | Admitting: Adult Health

## 2018-04-30 DIAGNOSIS — Z1231 Encounter for screening mammogram for malignant neoplasm of breast: Secondary | ICD-10-CM

## 2018-05-01 ENCOUNTER — Ambulatory Visit (HOSPITAL_COMMUNITY)
Admission: RE | Admit: 2018-05-01 | Discharge: 2018-05-01 | Disposition: A | Discharge status: Home / Self Care | Payer: BC Managed Care – PPO | Source: Ambulatory Visit | Attending: Adult Health | Admitting: Adult Health

## 2018-05-01 ENCOUNTER — Encounter (HOSPITAL_COMMUNITY): Payer: Self-pay

## 2018-05-01 DIAGNOSIS — Z1231 Encounter for screening mammogram for malignant neoplasm of breast: Secondary | ICD-10-CM | POA: Diagnosis not present

## 2018-05-05 ENCOUNTER — Other Ambulatory Visit: Payer: Self-pay | Admitting: Adult Health

## 2018-05-28 ENCOUNTER — Other Ambulatory Visit: Payer: Self-pay | Admitting: Adult Health

## 2018-05-28 ENCOUNTER — Telehealth: Payer: Self-pay | Admitting: Adult Health

## 2018-05-28 DIAGNOSIS — Z13220 Encounter for screening for lipoid disorders: Secondary | ICD-10-CM

## 2018-05-28 DIAGNOSIS — R5383 Other fatigue: Secondary | ICD-10-CM

## 2018-05-28 DIAGNOSIS — E039 Hypothyroidism, unspecified: Secondary | ICD-10-CM

## 2018-05-28 NOTE — Telephone Encounter (Signed)
Has appt in September, wants labs now

## 2018-05-28 NOTE — Telephone Encounter (Signed)
[facility] for her thyroid and cholesterol because she has not been feeling well. Pt states that she could come to [facility] tomorrow morning if she places the order. Please contact pt

## 2018-05-30 LAB — LIPID PANEL
Chol/HDL Ratio: 4.7 ratio — ABNORMAL HIGH (ref 0.0–4.4)
Cholesterol, Total: 173 mg/dL (ref 100–199)
HDL: 37 mg/dL — AB (ref >39)
LDL CALC: 105 mg/dL — AB (ref 0–99)
Triglycerides: 156 mg/dL — ABNORMAL HIGH (ref 0–149)
VLDL CHOLESTEROL CAL: 31 mg/dL (ref 5–40)

## 2018-05-30 LAB — COMPREHENSIVE METABOLIC PANEL
ALBUMIN: 4.5 g/dL (ref 3.5–5.5)
ALK PHOS: 86 IU/L (ref 39–117)
ALT: 11 IU/L (ref 0–32)
AST: 12 IU/L (ref 0–40)
Albumin/Globulin Ratio: 1.6 (ref 1.2–2.2)
BUN / CREAT RATIO: 17 (ref 9–23)
BUN: 12 mg/dL (ref 6–24)
Bilirubin Total: 0.5 mg/dL (ref 0.0–1.2)
CALCIUM: 9.4 mg/dL (ref 8.7–10.2)
CO2: 21 mmol/L (ref 20–29)
CREATININE: 0.71 mg/dL (ref 0.57–1.00)
Chloride: 104 mmol/L (ref 96–106)
GFR, EST AFRICAN AMERICAN: 119 mL/min/{1.73_m2} (ref >59)
GFR, EST NON AFRICAN AMERICAN: 103 mL/min/{1.73_m2} (ref >59)
GLUCOSE: 80 mg/dL (ref 65–99)
Globulin, Total: 2.9 g/dL (ref 1.5–4.5)
Potassium: 4.5 mmol/L (ref 3.5–5.2)
Sodium: 140 mmol/L (ref 134–144)
TOTAL PROTEIN: 7.4 g/dL (ref 6.0–8.5)

## 2018-05-30 LAB — CBC
Hematocrit: 44.9 % (ref 34.0–46.6)
Hemoglobin: 14.7 g/dL (ref 11.1–15.9)
MCH: 27.8 pg (ref 26.6–33.0)
MCHC: 32.7 g/dL (ref 31.5–35.7)
MCV: 85 fL (ref 79–97)
Platelets: 288 10*3/uL (ref 150–450)
RBC: 5.28 x10E6/uL (ref 3.77–5.28)
RDW: 13.6 % (ref 12.3–15.4)
WBC: 11.2 10*3/uL — AB (ref 3.4–10.8)

## 2018-05-30 LAB — T4, FREE: FREE T4: 1.33 ng/dL (ref 0.82–1.77)

## 2018-05-30 LAB — TSH: TSH: 4.7 u[IU]/mL — ABNORMAL HIGH (ref 0.450–4.500)

## 2018-06-01 ENCOUNTER — Telehealth: Payer: Self-pay | Admitting: Adult Health

## 2018-06-01 MED ORDER — LEVOTHYROXINE SODIUM 100 MCG PO TABS: 100.0000 ug | ORAL_TABLET | Freq: Every day | ORAL | 3 refills | Status: DC

## 2018-06-01 NOTE — Telephone Encounter (Signed)
Pt aware of labs, increase synthroid to 100 mcg and increase activity will check in September at visit

## 2018-07-09 ENCOUNTER — Ambulatory Visit (INDEPENDENT_AMBULATORY_CARE_PROVIDER_SITE_OTHER): Payer: BC Managed Care – PPO | Admitting: Adult Health

## 2018-07-09 ENCOUNTER — Encounter: Payer: Self-pay | Admitting: Adult Health

## 2018-07-09 ENCOUNTER — Other Ambulatory Visit (HOSPITAL_COMMUNITY)
Admission: RE | Admit: 2018-07-09 | Discharge: 2018-07-09 | Disposition: A | Discharge status: Home / Self Care | Payer: BC Managed Care – PPO | Source: Ambulatory Visit | Attending: Adult Health | Admitting: Adult Health

## 2018-07-09 VITALS — BP 141/89 | HR 67 | Ht 62.0 in | Wt 170.5 lb

## 2018-07-09 DIAGNOSIS — Z1211 Encounter for screening for malignant neoplasm of colon: Secondary | ICD-10-CM | POA: Diagnosis not present

## 2018-07-09 DIAGNOSIS — Z1212 Encounter for screening for malignant neoplasm of rectum: Secondary | ICD-10-CM | POA: Diagnosis not present

## 2018-07-09 DIAGNOSIS — Z01419 Encounter for gynecological examination (general) (routine) without abnormal findings: Secondary | ICD-10-CM | POA: Diagnosis not present

## 2018-07-09 DIAGNOSIS — E039 Hypothyroidism, unspecified: Secondary | ICD-10-CM | POA: Insufficient documentation

## 2018-07-09 DIAGNOSIS — Z3041 Encounter for surveillance of contraceptive pills: Secondary | ICD-10-CM | POA: Insufficient documentation

## 2018-07-09 LAB — HEMOCCULT GUIAC POC 1CARD (OFFICE): Fecal Occult Blood, POC: NEGATIVE

## 2018-07-09 MED ORDER — NORETHINDRONE 0.35 MG PO TABS: 1.0000 | ORAL_TABLET | Freq: Every day | ORAL | 4 refills | Status: DC

## 2018-07-09 NOTE — Progress Notes (Addendum)
Patient ID: Tami White, female   DOB: [birth year], 47 y.o.   MRN: [id number] History of Present Illness: Tami White is a 47 year old white female, married, in for well woman gyn exam and pap.She is a Clinical biochemist.   Current Medications, Allergies, Past Medical History, Past Surgical History, Family History and Social History were reviewed in [employer] record.     Review of Systems: Patient denies any headaches(occasionally with periods), hearing loss, fatigue, blurred vision, shortness of breath, chest pain, abdominal pain, problems with bowel movements, urination, or intercourse. No joint pain or mood swings. Periods are light on micronor.Has had itching dry spot in head. She says she feels better since increasing levothyroxine.     Physical Exam:BP (!) 141/89 (BP Location: Left Arm, Patient Position: Sitting, Cuff Size: Normal)   Pulse 67   Ht 5\' 2"  (1.575 m)   Wt 170 lb 8 oz (77.3 kg)   LMP 07/09/2018   BMI 31.18 kg/m  General:  Well developed, well nourished, no acute distress Skin:  Warm and dry, saw 1 spot where she scratched in head, no lesions(try different shampoo0 Neck:  Midline trachea, normal thyroid, good ROM, no lymphadenopathy Lungs; Clear to auscultation bilaterally Breast:  No dominant palpable mass, retraction, or nipple discharge Cardiovascular: Regular rate and rhythm Abdomen:  Soft, non tender, no hepatosplenomegaly Pelvic:  External genitalia is normal in appearance, no lesions.  The vagina is normal in appearance.Has brown period like blood in vault. Urethra has no lesions or masses. The cervix is bulbous.  Uterus is felt to be normal size, shape, and contour.  No adnexal masses or tenderness noted.Bladder is non tender, no masses felt. Rectal: Good sphincter tone, no polyps, or hemorrhoids felt.  Hemoccult negative. Extremities/musculoskeletal:  No swelling or varicosities noted, no clubbing or cyanosis Psych:  No mood changes, alert and  cooperative,seems happy PHQ 2 score 0. Examination chaperoned by Malachy Mood LPN.  Impression: 1. Encounter for gynecological examination with Papanicolaou smear of cervix   2. Hypothyroidism, unspecified type   3. Encounter for surveillance of contraceptive pills   4. Screening for colorectal cancer       Plan: Check TSH Continue levothyroxine 100 mcg for now Meds ordered this encounter  Medications  . norethindrone (ORTHO MICRONOR) 0.35 MG tablet    Sig: Take 1 tablet (0.35 mg total) by mouth daily.    Dispense:  3 Package    Refill:  4    Order Specific Question:   Supervising Provider    Answer:   Lazaro Arms [[id number]]   Physical in 1 year Pap in 3 if normal Mammogram yearly Colonoscopy or cologuard at 52

## 2018-07-10 LAB — TSH: TSH: 1.45 u[IU]/mL (ref 0.450–4.500)

## 2018-07-13 ENCOUNTER — Other Ambulatory Visit: Payer: Self-pay | Admitting: Adult Health

## 2018-07-13 MED ORDER — LEVOTHYROXINE SODIUM 100 MCG PO TABS: 100.0000 ug | ORAL_TABLET | Freq: Every day | ORAL | 12 refills | Status: DC

## 2018-07-13 NOTE — Progress Notes (Signed)
Refill synthroid for 1 year

## 2018-07-14 LAB — CYTOLOGY - PAP
Adequacy: ABSENT
Diagnosis: NEGATIVE
HPV: NOT DETECTED

## 2019-06-29 ENCOUNTER — Other Ambulatory Visit (HOSPITAL_COMMUNITY): Payer: Self-pay | Admitting: Adult Health

## 2019-06-29 DIAGNOSIS — Z1231 Encounter for screening mammogram for malignant neoplasm of breast: Secondary | ICD-10-CM

## 2019-07-02 ENCOUNTER — Other Ambulatory Visit: Payer: Self-pay | Admitting: Adult Health

## 2019-07-05 ENCOUNTER — Ambulatory Visit (HOSPITAL_COMMUNITY)
Admission: RE | Admit: 2019-07-05 | Discharge: 2019-07-05 | Disposition: A | Discharge status: Home / Self Care | Payer: BC Managed Care – PPO | Source: Ambulatory Visit | Attending: Adult Health | Admitting: Adult Health

## 2019-07-05 ENCOUNTER — Other Ambulatory Visit: Payer: Self-pay

## 2019-07-05 DIAGNOSIS — Z1231 Encounter for screening mammogram for malignant neoplasm of breast: Secondary | ICD-10-CM | POA: Insufficient documentation

## 2019-08-09 ENCOUNTER — Telehealth: Payer: Self-pay | Admitting: *Deleted

## 2019-08-09 NOTE — Telephone Encounter (Signed)
Called patient back, she is due for annual exam so scheduled her with Anderson Malta for visit.

## 2019-08-09 NOTE — Telephone Encounter (Signed)
Patient called with c/o of "odd feeling" in her back.  States she is having cramping when it's not time for her cycle, sensitivity in her pelvis as well.  Also states she is due to have her thyroid levels checked.

## 2019-08-13 ENCOUNTER — Encounter: Payer: Self-pay | Admitting: Adult Health

## 2019-08-13 ENCOUNTER — Other Ambulatory Visit: Payer: Self-pay

## 2019-08-13 ENCOUNTER — Ambulatory Visit (INDEPENDENT_AMBULATORY_CARE_PROVIDER_SITE_OTHER): Payer: BC Managed Care – PPO | Admitting: Adult Health

## 2019-08-13 VITALS — BP 138/81 | HR 64 | Ht 61.0 in | Wt 172.0 lb

## 2019-08-13 DIAGNOSIS — Z1212 Encounter for screening for malignant neoplasm of rectum: Secondary | ICD-10-CM

## 2019-08-13 DIAGNOSIS — R102 Pelvic and perineal pain: Secondary | ICD-10-CM

## 2019-08-13 DIAGNOSIS — Z01419 Encounter for gynecological examination (general) (routine) without abnormal findings: Secondary | ICD-10-CM | POA: Insufficient documentation

## 2019-08-13 DIAGNOSIS — Z1211 Encounter for screening for malignant neoplasm of colon: Secondary | ICD-10-CM | POA: Diagnosis not present

## 2019-08-13 DIAGNOSIS — Z3041 Encounter for surveillance of contraceptive pills: Secondary | ICD-10-CM

## 2019-08-13 DIAGNOSIS — E039 Hypothyroidism, unspecified: Secondary | ICD-10-CM

## 2019-08-13 LAB — HEMOCCULT GUIAC POC 1CARD (OFFICE): Fecal Occult Blood, POC: NEGATIVE

## 2019-08-13 MED ORDER — NORETHINDRONE 0.35 MG PO TABS: 1.0000 | ORAL_TABLET | Freq: Every day | ORAL | 4 refills | Status: DC

## 2019-08-13 MED ORDER — LEVOTHYROXINE SODIUM 100 MCG PO TABS: ORAL_TABLET | ORAL | 12 refills | Status: DC

## 2019-08-13 NOTE — Progress Notes (Signed)
Patient ID: Tami White, female   DOB: [birth year], 48 y.o.   MRN: [id number] History of Present Illness: Tami White is a 48 year old white female, married, [id number], in for a well woman gyn exam, she had a normal pap with negative HPV 07/09/18.    Current Medications, Allergies, Past Medical History, Past Surgical History, Family History and Social History were reviewed in [employer] record.     Review of Systems: Patient denies any headaches, hearing loss, fatigue, blurred vision, shortness of breath, chest pain, denies any  problems with bowel movements, urination, or intercourse. No joint pain or mood swings. +pain in pelvic area  At times esp RLQ and bloating    Physical Exam:BP 138/81 (BP Location: Left Arm, Patient Position: Sitting, Cuff Size: Normal)   Pulse 64   Ht 5\' 1"  (1.549 m)   Wt 172 lb (78 kg)   LMP 08/04/2019 (Exact Date)   BMI 32.50 kg/m  General:  Well developed, well nourished, no acute distress Skin:  Warm and dry Neck:  Midline trachea, normal thyroid, good ROM, no lymphadenopathy Lungs; Clear to auscultation bilaterally Breast:  No dominant palpable mass, retraction, or nipple discharge Cardiovascular: Regular rate and rhythm Abdomen:  Soft, non tender, no hepatosplenomegaly Pelvic:  External genitalia is normal in appearance, no lesions.  The vagina is normal in appearance. Urethra has no lesions or masses. The cervix is smooth.  Uterus is felt to be normal size, shape, and contour.  No adnexal masses or tenderness noted.Bladder is non tender, no masses felt. Rectal: Good sphincter tone, no polyps, or hemorrhoids felt.  Hemoccult negative. Extremities/musculoskeletal:  No swelling or varicosities noted, no clubbing or cyanosis Psych:  No mood changes, alert and cooperative,seems happy Fall risk is low PHQ 2 score is 1. Examination chaperoned by Rolena Infante LPN  Impression and Plan: 1. Encounter for well woman exam with routine  gynecological exam -pap in 2022 -Physical in 1 year -Check CBC,CMP,and lipids -mammogram yearly  2. Screening for colorectal cancer -colonoscopy at 48   3. Hypothyroidism, unspecified type -Check TSH and free T4 -Will refill synthroid   4. Encounter for surveillance of contraceptive pills -Will continue POP Meds ordered this encounter  Medications  . levothyroxine (SYNTHROID) 100 MCG tablet    Sig: TAKE 1 TABLET BY MOUTH ONCE DAILY BEFORE BREAKFAST.    Dispense:  30 tablet    Refill:  12    Order Specific Question:   Supervising Provider    Answer:   Elonda Husky, LUTHER H [[id number]]  . norethindrone (NORA-BE) 0.35 MG tablet    Sig: Take 1 tablet (0.35 mg total) by mouth daily.    Dispense:  84 tablet    Refill:  4    Order Specific Question:   Supervising Provider    Answer:   Elonda Husky, LUTHER H [[id number]]     5. Pelvic pain -return in about 2 weeks for GYN [place], will talk when result sback

## 2019-08-25 LAB — COMPREHENSIVE METABOLIC PANEL
ALT: 11 IU/L (ref 0–32)
AST: 14 IU/L (ref 0–40)
Albumin/Globulin Ratio: 1.6 (ref 1.2–2.2)
Albumin: 4.2 g/dL (ref 3.8–4.8)
Alkaline Phosphatase: 86 IU/L (ref 39–117)
BUN/Creatinine Ratio: 14 (ref 9–23)
BUN: 9 mg/dL (ref 6–24)
Bilirubin Total: 0.3 mg/dL (ref 0.0–1.2)
CO2: 23 mmol/L (ref 20–29)
Calcium: 9.2 mg/dL (ref 8.7–10.2)
Chloride: 105 mmol/L (ref 96–106)
Creatinine, Ser: 0.65 mg/dL (ref 0.57–1.00)
GFR calc Af Amer: 123 mL/min/{1.73_m2} (ref >59)
GFR calc non Af Amer: 107 mL/min/{1.73_m2} (ref >59)
Globulin, Total: 2.6 g/dL (ref 1.5–4.5)
Glucose: 86 mg/dL (ref 65–99)
Potassium: 4.6 mmol/L (ref 3.5–5.2)
Sodium: 138 mmol/L (ref 134–144)
Total Protein: 6.8 g/dL (ref 6.0–8.5)

## 2019-08-25 LAB — LIPID PANEL
Chol/HDL Ratio: 4.5 ratio — ABNORMAL HIGH (ref 0.0–4.4)
Cholesterol, Total: 170 mg/dL (ref 100–199)
HDL: 38 mg/dL — ABNORMAL LOW (ref >39)
LDL Chol Calc (NIH): 111 mg/dL — ABNORMAL HIGH (ref 0–99)
Triglycerides: 117 mg/dL (ref 0–149)
VLDL Cholesterol Cal: 21 mg/dL (ref 5–40)

## 2019-08-25 LAB — CBC
Hematocrit: 42.7 % (ref 34.0–46.6)
Hemoglobin: 14.3 g/dL (ref 11.1–15.9)
MCH: 27.8 pg (ref 26.6–33.0)
MCHC: 33.5 g/dL (ref 31.5–35.7)
MCV: 83 fL (ref 79–97)
Platelets: 287 10*3/uL (ref 150–450)
RBC: 5.15 x10E6/uL (ref 3.77–5.28)
RDW: 12.9 % (ref 11.7–15.4)
WBC: 9.4 10*3/uL (ref 3.4–10.8)

## 2019-08-25 LAB — TSH: TSH: 1.36 u[IU]/mL (ref 0.450–4.500)

## 2019-08-25 LAB — T4, FREE: Free T4: 1.56 ng/dL (ref 0.82–1.77)

## 2019-08-26 ENCOUNTER — Ambulatory Visit (INDEPENDENT_AMBULATORY_CARE_PROVIDER_SITE_OTHER): Payer: BC Managed Care – PPO

## 2019-08-26 ENCOUNTER — Other Ambulatory Visit: Payer: Self-pay

## 2019-08-26 DIAGNOSIS — R102 Pelvic and perineal pain: Secondary | ICD-10-CM

## 2019-08-26 NOTE — Progress Notes (Signed)
PELVIC US TA/TV: homogeneous anteverted uterus,wnl,EEC 4.7 mm,normal ovaries bilat,no free fluid,no pain during ultrasound,unable to slide ovaries

## 2020-01-26 ENCOUNTER — Ambulatory Visit (INDEPENDENT_AMBULATORY_CARE_PROVIDER_SITE_OTHER): Payer: BC Managed Care – PPO

## 2020-01-26 ENCOUNTER — Other Ambulatory Visit: Payer: Self-pay

## 2020-01-26 ENCOUNTER — Ambulatory Visit (INDEPENDENT_AMBULATORY_CARE_PROVIDER_SITE_OTHER): Payer: BC Managed Care – PPO | Admitting: Cardiovascular Disease

## 2020-01-26 ENCOUNTER — Encounter: Payer: Self-pay | Admitting: Cardiovascular Disease

## 2020-01-26 ENCOUNTER — Telehealth: Payer: Self-pay | Admitting: Cardiovascular Disease

## 2020-01-26 VITALS — BP 121/65 | HR 83 | Temp 97.4°F | Ht 62.0 in | Wt 172.0 lb

## 2020-01-26 DIAGNOSIS — I10 Essential (primary) hypertension: Secondary | ICD-10-CM

## 2020-01-26 DIAGNOSIS — R002 Palpitations: Secondary | ICD-10-CM

## 2020-01-26 NOTE — Progress Notes (Signed)
CARDIOLOGY CONSULT NOTE  Patient ID: Tami White MRN: [id number] DOB/AGE: 47-Sep-1972 47 y.o.  Admit date: (Not on file) Primary Physician: Patient, No Pcp Per  Reason for Consultation: Palpitations  HPI: Tami White is a 49 y.o. female who is being seen today for the evaluation of palpitations at the request of Celene Squibb, MD.   Past medical history includes hypothyroidism and hypertension.  I reviewed notes from her PCP dated 01/10/2020.  She complained of increasing palpitations at that time.  Blood pressure was normal.  She has apparently been under a lot of stress.  I reviewed labs which include total cholesterol 171, triglycerides 123, HDL 37, LDL 112, TSH 1.7, free T4 1.72.  Upon speaking with her, she told me that her father-in-law passed away on 01/09/2020 and her father passed away last week and the funeral was yesterday.  She describes a hard pounding sensation in her chest.  She said it was occurring as she was checking in and getting into the exam room but wondered if it was because she had to see a heart specialist.  ECG performed in the office today which I ordered and personally interpreted demonstrates normal sinus rhythm with no ischemic ST segment or T-wave abnormalities, nor any arrhythmias.  She denies chest pain, exertional dyspnea, leg swelling, orthopnea, dizziness, and paroxysmal atrial dyspnea.  She drinks 1 cup of coffee in the morning and a diet Dr. Malachi Bonds in the afternoon.   Social history: Her father passed away in 02-04-2020.  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  No Known Allergies  Current Outpatient Medications  Medication Sig Dispense Refill  . levothyroxine (SYNTHROID) 100 MCG tablet TAKE 1 TABLET BY MOUTH ONCE DAILY BEFORE BREAKFAST. 30 tablet 12  . lisinopril (ZESTRIL) 10 MG tablet Take 10 mg by mouth daily.    . norethindrone (NORA-BE) 0.35 MG tablet Take 1 tablet (0.35 mg total) by mouth  daily. 84 tablet 4   No current facility-administered medications for this visit.    Past Medical History:  Diagnosis Date  . Anxiety   . Contraceptive management 11/23/2013  . Hemorrhoids   . Sinus infection 11/23/2013  . Thyroid disease    Hypothyroidism    Past Surgical History:  Procedure Laterality Date  . CESAREAN SECTION      Social History   Socioeconomic History  . Marital status: Married    Spouse name: Not on file  . Number of children: Not on file  . Years of education: Not on file  . Highest education level: Not on file  Occupational History  . Not on file  Tobacco Use  . Smoking status: Never Smoker  . Smokeless tobacco: Never Used  Substance and Sexual Activity  . Alcohol use: No  . Drug use: No  . Sexual activity: Yes    Birth control/protection: Pill  Other Topics Concern  . Not on file  Social History Narrative  . Not on file   Social Determinants of Health   Financial Resource Strain:   . Difficulty of Paying Living Expenses:   Food Insecurity:   . Worried About Charity fundraiser in the Last Year:   . Arboriculturist in the Last Year:   Transportation Needs:   . Film/video editor (Medical):   Marland Kitchen Lack of Transportation (Non-Medical):   Physical Activity:   . Days of Exercise per Week:   . Minutes of  Exercise per Session:   Stress:   . Feeling of Stress :   Social Connections:   . Frequency of Communication with Friends and Family:   . Frequency of Social Gatherings with Friends and Family:   . Attends Religious Services:   . Active Member of Clubs or Organizations:   . Attends Banker Meetings:   Marland Kitchen Marital Status:   Intimate Partner Violence:   . Fear of Current or Ex-Partner:   . Emotionally Abused:   Marland Kitchen Physically Abused:   . Sexually Abused:      No family history of premature CAD in 1st degree relatives.  Current Meds  Medication Sig  . levothyroxine (SYNTHROID) 100 MCG tablet TAKE 1 TABLET BY MOUTH  ONCE DAILY BEFORE BREAKFAST.  Marland Kitchen lisinopril (ZESTRIL) 10 MG tablet Take 10 mg by mouth daily.  . norethindrone (NORA-BE) 0.35 MG tablet Take 1 tablet (0.35 mg total) by mouth daily.      Review of systems complete and found to be negative unless listed above in HPI   Marlyn Corporal, RN was present throughout the entirety of the encounter.  Physical exam Blood pressure 121/65, pulse 83, temperature (!) 97.4 F (36.3 C), height 5\' 2"  (1.575 m), weight 172 lb (78 kg). General: NAD Neck: No JVD, no thyromegaly or thyroid nodule.  Lungs: Clear to auscultation bilaterally with normal respiratory effort. CV: Nondisplaced PMI. Regular rate and rhythm, normal S1/S2, no S3/S4, no murmur.  No peripheral edema.  No carotid bruit.    Abdomen: Soft, nontender, no distention.  Skin: Intact without lesions or rashes.  Neurologic: Alert and oriented x 3.  Psych: Normal affect. Extremities: No clubbing or cyanosis.  HEENT: Normal.   ECG: Most recent ECG reviewed.   Labs: Lab Results  Component Value Date/Time   K 4.6 08/24/2019 08:37 AM   BUN 9 08/24/2019 08:37 AM   CREATININE 0.65 08/24/2019 08:37 AM   ALT 11 08/24/2019 08:37 AM   TSH 1.360 08/24/2019 08:37 AM   HGB 14.3 08/24/2019 08:37 AM     Lipids: Lab Results  Component Value Date/Time   LDLCALC 111 (H) 08/24/2019 08:37 AM   CHOL 170 08/24/2019 08:37 AM   TRIG 117 08/24/2019 08:37 AM   HDL 38 (L) 08/24/2019 08:37 AM        ASSESSMENT AND PLAN:   1.  Palpitations: Symptoms may be due to increased levels of stress due to her father and father-in-law's recent deaths.  She does have a history of hypertension and hypothyroidism although both are well controlled at the present time. I will order a 2-D echocardiogram with Doppler to evaluate cardiac structure, function, and regional wall motion. I will obtain a 1 week event monitor for further characterization.    Disposition: Follow up in 3 months virtual  visit  Signed: 08/26/2019, M.D., F.A.C.C.  01/26/2020, 2:53 PM

## 2020-01-26 NOTE — Patient Instructions (Signed)
Medication Instructions:  Your physician recommends that you continue on your current medications as directed. Please refer to the Current Medication list given to you today.  *If you need a refill on your cardiac medications before your next appointment, please call your pharmacy*   Lab Work: None today If you have labs (blood work) drawn today and your tests are completely normal, you will receive your results only by: Marland Kitchen MyChart Message (if you have MyChart) OR . A paper copy in the mail If you have any lab test that is abnormal or we need to change your treatment, we will call you to review the results.   Testing/Procedures: Your physician has requested that you have an echocardiogram. Echocardiography is a painless test that uses sound waves to create images of your heart. It provides your doctor with information about the size and shape of your heart and how well your heart's chambers and valves are working. This procedure takes approximately one hour. There are no restrictions for this procedure.  Your physician has recommended that you wear an event monitor. Event monitors are medical devices that record the heart's electrical activity. Doctors most often [place] these monitors to diagnose arrhythmias. Arrhythmias are problems with the speed or rhythm of the heartbeat. The monitor is a small, portable device. You can wear one while you do your normal daily activities. This is usually used to diagnose what is causing palpitations/syncope (passing out).     Follow-Up: At [facility], you and your health needs are our priority.  As part of our continuing mission to provide you with exceptional heart care, we have created designated Provider Care Teams.  These Care Teams include your primary Cardiologist (physician) and Advanced Practice Providers (APPs -  Physician Assistants and Nurse Practitioners) who all work together to provide you with the care you need, when you need it.  We  recommend signing up for the patient portal called "MyChart".  Sign up information is provided on this After Visit Summary.  MyChart is used to connect with patients for Virtual Visits (Telemedicine).  Patients are able to view lab/test results, encounter notes, upcoming appointments, etc.  Non-urgent messages can be sent to your provider as well.   To learn more about what you can do with MyChart, go to [email or web address].    Your next appointment:   3 month(s)  The format for your next appointment:   Virtual Visit   Provider:   Prentice Docker, MD   Other Instructions None    Thank you for choosing [facility] !

## 2020-01-26 NOTE — Telephone Encounter (Signed)
°  Precert needed for:   7 day event

## 2020-01-26 NOTE — Telephone Encounter (Signed)
  Precert needed for:  ECHO   Location: [facility]    Date: February 01, 2020

## 2020-02-01 ENCOUNTER — Ambulatory Visit (HOSPITAL_COMMUNITY): Payer: BC Managed Care – PPO

## 2020-02-16 ENCOUNTER — Other Ambulatory Visit: Payer: Self-pay

## 2020-02-16 ENCOUNTER — Ambulatory Visit (HOSPITAL_COMMUNITY)
Admission: RE | Admit: 2020-02-16 | Discharge: 2020-02-16 | Disposition: A | Discharge status: Home / Self Care | Payer: BC Managed Care – PPO | Source: Ambulatory Visit | Attending: Cardiovascular Disease | Admitting: Cardiovascular Disease

## 2020-02-16 DIAGNOSIS — R002 Palpitations: Secondary | ICD-10-CM | POA: Diagnosis not present

## 2020-02-16 NOTE — Progress Notes (Signed)
*  PRELIMINARY RESULTS* Echocardiogram 2D Echocardiogram has been performed.  Stacey Drain 02/16/2020, 3:55 PM

## 2020-02-22 ENCOUNTER — Other Ambulatory Visit: Payer: Self-pay

## 2020-05-09 ENCOUNTER — Telehealth: Payer: BC Managed Care – PPO | Admitting: Cardiovascular Disease

## 2020-05-09 ENCOUNTER — Other Ambulatory Visit: Payer: Self-pay

## 2020-06-02 ENCOUNTER — Encounter: Payer: Self-pay | Admitting: Student

## 2020-06-02 ENCOUNTER — Telehealth: Payer: Self-pay | Admitting: *Deleted

## 2020-06-02 ENCOUNTER — Telehealth (INDEPENDENT_AMBULATORY_CARE_PROVIDER_SITE_OTHER): Payer: BC Managed Care – PPO | Admitting: Student

## 2020-06-02 ENCOUNTER — Other Ambulatory Visit: Payer: Self-pay

## 2020-06-02 VITALS — BP 115/69 | HR 70 | Ht 62.5 in | Wt 170.0 lb

## 2020-06-02 DIAGNOSIS — I10 Essential (primary) hypertension: Secondary | ICD-10-CM

## 2020-06-02 DIAGNOSIS — R002 Palpitations: Secondary | ICD-10-CM | POA: Diagnosis not present

## 2020-06-02 NOTE — Telephone Encounter (Signed)
  Cardiology Consultation:     Patient Consent for Virtual Visit         Tami White has provided verbal consent on 06/02/2020 for a virtual visit (video or telephone).   CONSENT FOR VIRTUAL VISIT FOR:  Tami White  By participating in this virtual visit I agree to the following:  I hereby voluntarily request, consent and authorize [facility] and its employed or contracted physicians, physician assistants, nurse practitioners or other licensed health care professionals (the Practitioner), to provide me with telemedicine health care services (the "Services") as deemed necessary by the treating Practitioner. I acknowledge and consent to receive the Services by the Practitioner via telemedicine. I understand that the telemedicine visit will involve communicating with the Practitioner through live audiovisual communication technology and the disclosure of certain medical information by electronic transmission. I acknowledge that I have been given the opportunity to request an in-person assessment or other available alternative prior to the telemedicine visit and am voluntarily participating in the telemedicine visit.  I understand that I have the right to withhold or withdraw my consent to the use of telemedicine in the course of my care at any time, without affecting my right to future care or treatment, and that the Practitioner or I may terminate the telemedicine visit at any time. I understand that I have the right to inspect all information obtained and/or recorded in the course of the telemedicine visit and may receive copies of available information for a reasonable fee.  I understand that some of the potential risks of receiving the Services via telemedicine include:  Marland Kitchen Delay or interruption in medical evaluation due to technological equipment failure or disruption; . Information transmitted may not be sufficient (e.g. poor resolution of images) to allow for appropriate medical decision  making by the Practitioner; and/or  . In rare instances, security protocols could fail, causing a breach of personal health information.  Furthermore, I acknowledge that it is my responsibility to provide information about my medical history, conditions and care that is complete and accurate to the best of my ability. I acknowledge that Practitioner's advice, recommendations, and/or decision may be based on factors not within their control, such as incomplete or inaccurate data provided by me or distortions of diagnostic images or specimens that may result from electronic transmissions. I understand that the practice of medicine is not an exact science and that Practitioner makes no warranties or guarantees regarding treatment outcomes. I acknowledge that a copy of this consent can be made available to me via my patient portal [facility] MyChart), or I can request a printed copy by calling the office of [facility].    I understand that my insurance will be billed for this visit.   I have read or had this consent read to me. . I understand the contents of this consent, which adequately explains the benefits and risks of the Services being provided via telemedicine.  . I have been provided ample opportunity to ask questions regarding this consent and the Services and have had my questions answered to my satisfaction. . I give my informed consent for the services to be provided through the use of telemedicine in my medical care

## 2020-06-02 NOTE — Progress Notes (Signed)
Virtual Visit via Telephone Note   This visit type was conducted due to national recommendations for restrictions regarding the COVID-19 Pandemic (e.g. social distancing) in an effort to limit this patient's exposure and mitigate transmission in our community.  Due to her co-morbid illnesses, this patient is at least at moderate risk for complications without adequate follow up.  This format is felt to be most appropriate for this patient at this time.  The patient did not have access to video technology/had technical difficulties with video requiring transitioning to audio format only (telephone).  All issues noted in this document were discussed and addressed.  No physical exam could be performed with this format.  Please refer to the patient's chart for her  consent to telehealth for [facility].    Date:  06/02/2020   ID:  Tami White, DOB 03-31-1971, MRN [id number] The patient was identified using 2 identifiers.  Patient Location: Home Provider Location: Office/Clinic  PCP:  Patient, No Pcp Per  Cardiologist:  Previously followed by Dr. Purvis Sheffield Electrophysiologist:  None   Evaluation Performed:  Follow-Up Visit  Chief Complaint:  36-month visit  History of Present Illness:    Tami White is a 49 y.o. female with past medical history of HTN, hypothyroidism and palpitations who presents to the office today for 4-month follow-up.  She was last examined by Dr. Purvis Sheffield in 01/2020 as a new patient referral for palpitations. She described it as an intermittent pounding sensation in her chest and was unaware of any associated symptoms. She did consume approximately 1 cup of coffee daily and occasional soda. Her father and father-in-law had recently passed away, therefore it was thought that her symptoms might be due to the increased stress she was under at that time. An echocardiogram along with 1 week event monitor were recommended for further assessment. Her echocardiogram showed  a preserved EF of 65 to 70% with no regional wall motion abnormalities. Her event monitor showed sinus rhythm with rare PAC's but no significant arrhythmias.  In talking with the patient today, she reports overall doing well from a cardiac perspective since her last visit. She reports her palpitations spontaneously resolved a few months ago and she denies any recurrent symptoms. Her breathing has been at baseline with no recent chest pain or dyspnea. She does stay active around her house and has been watering plants along with having a small garden.   Past Medical History:  Diagnosis Date  . Anxiety   . Contraceptive management 11/23/2013  . Hemorrhoids   . Sinus infection 11/23/2013  . Thyroid disease    Hypothyroidism   Past Surgical History:  Procedure Laterality Date  . CESAREAN SECTION       Current Meds  Medication Sig  . levothyroxine (SYNTHROID) 100 MCG tablet TAKE 1 TABLET BY MOUTH ONCE DAILY BEFORE BREAKFAST.  Marland Kitchen lisinopril (ZESTRIL) 10 MG tablet Take 10 mg by mouth daily.  . norethindrone (NORA-BE) 0.35 MG tablet Take 1 tablet (0.35 mg total) by mouth daily.     Allergies:   Patient has no known allergies.   Social History   Tobacco Use  . Smoking status: Never Smoker  . Smokeless tobacco: Never Used  Vaping Use  . Vaping Use: Never used  Substance Use Topics  . Alcohol use: No  . Drug use: No     Family Hx: The patient's family history includes Arthritis in her father and paternal aunt; Asthma in her maternal grandmother; COPD in her maternal grandmother; Cancer in  her maternal grandfather and paternal aunt; Cancer (age of onset: 7) in her father; Diabetes in her maternal grandfather; Heart attack in her paternal uncle; Heart disease in her maternal grandfather; Hyperlipidemia in her father and mother; Hypertension in her father, mother, and paternal grandfather; Kidney disease in her paternal grandmother; Stroke in her paternal grandfather.  ROS:   Please see  the history of present illness.     All other systems reviewed and are negative.   Prior CV studies:   The following studies were reviewed today:  Echocardiogram: 02/2020 IMPRESSIONS    1. Left ventricular ejection fraction, by estimation, is 65 to 70%. The  left ventricle has normal function. The left ventricle has no regional  wall motion abnormalities. Left ventricular diastolic parameters were  normal.  2. Right ventricular systolic function is normal. The right ventricular  size is normal. Tricuspid regurgitation signal is inadequate for assessing  PA pressure.  3. The mitral valve is grossly normal. Trivial mitral valve  regurgitation.  4. The aortic valve is tricuspid. Aortic valve regurgitation is not  visualized.  5. The inferior vena cava is normal in size with greater than 50%  respiratory variability, suggesting right atrial pressure of 3 mmHg.   Cardiac Monitor: 02/2020  Sinus rhythm with rare PACs, average heart rate 76 bpm. There were no arrhythmias.   Labs/Other Tests and Data Reviewed:    EKG:  An ECG dated 01/26/2020 was personally reviewed today and demonstrated:  NSR, HR 77 with no acute ST abnormalities when compared to prior tracings.   Recent Labs: 08/24/2019: ALT 11; BUN 9; Creatinine, Ser 0.65; Hemoglobin 14.3; Platelets 287; Potassium 4.6; Sodium 138; TSH 1.360   Recent Lipid Panel Lab Results  Component Value Date/Time   CHOL 170 08/24/2019 08:37 AM   TRIG 117 08/24/2019 08:37 AM   HDL 38 (L) 08/24/2019 08:37 AM   CHOLHDL 4.5 (H) 08/24/2019 08:37 AM   LDLCALC 111 (H) 08/24/2019 08:37 AM    Wt Readings from Last 3 Encounters:  06/02/20 170 lb (77.1 kg)  01/26/20 172 lb (78 kg)  08/13/19 172 lb (78 kg)     Objective:    Vital Signs:  BP 115/69   Pulse 70   Ht 5' 2.5" (1.588 m)   Wt 170 lb (77.1 kg)   BMI 30.60 kg/m    General: Pleasant female sounding in NAD Psych: Normal affect. Neuro: Alert and oriented X 3.  Lungs:   Resp regular and unlabored while talking on the phone.   ASSESSMENT & PLAN:    1. Palpitations - She denies any recurrent symptoms since her last visit. Her event monitor was reassuring and showed normal sinus rhythm with rare PAC's but no significant arrhythmias and her echocardiogram showed a preserved EF with no wall motion abnormalities as outlined above. - Will continue with conservative therapy at this time given no recurrent symptoms. We reviewed that if she did have symptoms in the future, would initially recommend reducing caffeine intake and could consider a PRN beta-blocker but this is not indicated at this time.  2.  HTN - BP was well controlled at 115/69 on most recent check. Continue current medication regimen with Lisinopril 10 mg daily.   COVID-19 Education: The signs and symptoms of COVID-19 were discussed with the patient and how to seek care for testing (follow up with PCP or arrange E-visit). The importance of social distancing was discussed today.  Time:   Today, I have spent 12 minutes with the  patient with telehealth technology discussing the above problems.     Medication Adjustments/Labs and Tests Ordered: Current medicines are reviewed at length with the patient today.  Concerns regarding medicines are outlined above.   Tests Ordered: No orders of the defined types were placed in this encounter.   Medication Changes: No orders of the defined types were placed in this encounter.   Follow Up:  On a PRN basis.   Signed, Ellsworth Lennox, PA-C  06/02/2020 1:12 PM    [facility]

## 2020-06-02 NOTE — Patient Instructions (Signed)
Medication Instructions:  Your physician recommends that you continue on your current medications as directed. Please refer to the Current Medication list given to you today.  *If you need a refill on your cardiac medications before your next appointment, please call your pharmacy*   Lab Work: NONE   If you have labs (blood work) drawn today and your tests are completely normal, you will receive your results only by: Marland Kitchen MyChart Message (if you have MyChart) OR . A paper copy in the mail If you have any lab test that is abnormal or we need to change your treatment, we will call you to review the results.   Testing/Procedures: NONE   Follow-Up: At [facility], you and your health needs are our priority.  As part of our continuing mission to provide you with exceptional heart care, we have created designated Provider Care Teams.  These Care Teams include your primary Cardiologist (physician) and Advanced Practice Providers (APPs -  Physician Assistants and Nurse Practitioners) who all work together to provide you with the care you need, when you need it.  We recommend signing up for the patient portal called "MyChart".  Sign up information is provided on this After Visit Summary.  MyChart is used to connect with patients for Virtual Visits (Telemedicine).  Patients are able to view lab/test results, encounter notes, upcoming appointments, etc.  Non-urgent messages can be sent to your provider as well.   To learn more about what you can do with MyChart, go to [email or web address].    Your next appointment:    As Needed   The format for your next appointment:   In Person  Provider:      Other Instructions Thank you for choosing [facility]!

## 2020-08-16 ENCOUNTER — Other Ambulatory Visit (HOSPITAL_COMMUNITY): Payer: Self-pay | Admitting: Adult Health

## 2020-08-16 DIAGNOSIS — Z1231 Encounter for screening mammogram for malignant neoplasm of breast: Secondary | ICD-10-CM

## 2020-08-17 ENCOUNTER — Ambulatory Visit (HOSPITAL_COMMUNITY)
Admission: RE | Admit: 2020-08-17 | Discharge: 2020-08-17 | Disposition: A | Discharge status: Home / Self Care | Payer: BC Managed Care – PPO | Source: Ambulatory Visit | Attending: Adult Health | Admitting: Adult Health

## 2020-08-17 ENCOUNTER — Other Ambulatory Visit: Payer: Self-pay

## 2020-08-17 DIAGNOSIS — Z1231 Encounter for screening mammogram for malignant neoplasm of breast: Secondary | ICD-10-CM | POA: Insufficient documentation

## 2020-08-21 ENCOUNTER — Other Ambulatory Visit (HOSPITAL_COMMUNITY): Payer: Self-pay | Admitting: Adult Health

## 2020-08-21 DIAGNOSIS — R928 Other abnormal and inconclusive findings on diagnostic imaging of breast: Secondary | ICD-10-CM

## 2020-08-22 ENCOUNTER — Telehealth: Payer: Self-pay | Admitting: Adult Health

## 2020-08-22 NOTE — Telephone Encounter (Signed)
[place] aware that mammogram showed asymmetry right breast and has appt for 09/08/20,

## 2020-08-22 NOTE — Telephone Encounter (Signed)
Pt had abnormal mammogram & stated that we were to call her with her next steps, she's not heard from [place] & is calling to check on this   Please advise & call pt

## 2020-09-08 ENCOUNTER — Ambulatory Visit (HOSPITAL_COMMUNITY): Payer: BC Managed Care – PPO

## 2020-09-08 ENCOUNTER — Inpatient Hospital Stay (HOSPITAL_COMMUNITY): Admission: RE | Admit: 2020-09-08 | Payer: BC Managed Care – PPO | Source: Ambulatory Visit

## 2020-09-12 ENCOUNTER — Ambulatory Visit (HOSPITAL_COMMUNITY)
Admission: RE | Admit: 2020-09-12 | Discharge: 2020-09-12 | Disposition: A | Discharge status: Home / Self Care | Payer: BC Managed Care – PPO | Source: Ambulatory Visit | Attending: Adult Health | Admitting: Adult Health

## 2020-09-12 ENCOUNTER — Other Ambulatory Visit: Payer: Self-pay

## 2020-09-12 DIAGNOSIS — R928 Other abnormal and inconclusive findings on diagnostic imaging of breast: Secondary | ICD-10-CM | POA: Diagnosis present

## 2021-02-12 ENCOUNTER — Telehealth: Payer: Self-pay

## 2021-02-12 MED ORDER — NORETHINDRONE 0.35 MG PO TABS: 1.0000 | ORAL_TABLET | Freq: Every day | ORAL | 4 refills | Status: DC

## 2021-02-12 NOTE — Addendum Note (Signed)
Addended by: Cyril Mourning A on: 02/12/2021 03:47 PM   Modules accepted: Orders

## 2021-02-12 NOTE — Telephone Encounter (Signed)
Pt needs a refill on Micronor birth control until she can be seen in June. Thanks!! JSY

## 2021-02-12 NOTE — Telephone Encounter (Signed)
Pt requesting a BC refill to last until her PAP on 04/20/2021, she stated that she missed a dose on 02/11/2021

## 2021-02-12 NOTE — Telephone Encounter (Signed)
Refilled norethindrone

## 2021-04-20 ENCOUNTER — Encounter: Payer: Self-pay | Admitting: Adult Health

## 2021-04-20 ENCOUNTER — Other Ambulatory Visit: Payer: Self-pay

## 2021-04-20 ENCOUNTER — Ambulatory Visit (INDEPENDENT_AMBULATORY_CARE_PROVIDER_SITE_OTHER): Payer: BC Managed Care – PPO | Admitting: Adult Health

## 2021-04-20 ENCOUNTER — Other Ambulatory Visit (HOSPITAL_COMMUNITY)
Admission: RE | Admit: 2021-04-20 | Discharge: 2021-04-20 | Disposition: A | Discharge status: Home / Self Care | Payer: BC Managed Care – PPO | Source: Ambulatory Visit | Attending: Adult Health | Admitting: Adult Health

## 2021-04-20 VITALS — BP 132/72 | HR 60 | Ht 62.0 in | Wt 156.0 lb

## 2021-04-20 DIAGNOSIS — Z01419 Encounter for gynecological examination (general) (routine) without abnormal findings: Secondary | ICD-10-CM | POA: Insufficient documentation

## 2021-04-20 DIAGNOSIS — Z3041 Encounter for surveillance of contraceptive pills: Secondary | ICD-10-CM | POA: Diagnosis not present

## 2021-04-20 DIAGNOSIS — Z1211 Encounter for screening for malignant neoplasm of colon: Secondary | ICD-10-CM | POA: Diagnosis not present

## 2021-04-20 DIAGNOSIS — E039 Hypothyroidism, unspecified: Secondary | ICD-10-CM | POA: Diagnosis not present

## 2021-04-20 LAB — HEMOCCULT GUIAC POC 1CARD (OFFICE): Fecal Occult Blood, POC: NEGATIVE

## 2021-04-20 MED ORDER — LEVOTHYROXINE SODIUM 100 MCG PO TABS: ORAL_TABLET | ORAL | 12 refills | Status: DC

## 2021-04-20 MED ORDER — NORETHINDRONE 0.35 MG PO TABS: 1.0000 | ORAL_TABLET | Freq: Every day | ORAL | 4 refills | Status: DC

## 2021-04-20 NOTE — Progress Notes (Signed)
Patient ID: Gladys Deckard, female   DOB: 01-Apr-1971, 50 y.o.   MRN: [id number] History of Present Illness: Elesa is a 50 year old white female, married, [id number], in for a well woman gyn exam and pap.She is still working as a Clinical biochemist. PCP is Dr Margo Aye.   Current Medications, Allergies, Past Medical History, Past Surgical History, Family History and Social History were reviewed in [employer] record.     Review of Systems: Patient denies any headaches, hearing loss, fatigue, blurred vision, shortness of breath, chest pain, abdominal pain, problems with bowel movements, urination, or intercourse. No joint pain or mood swings.     Physical Exam:BP 132/72 (BP Location: Left Arm, Patient Position: Sitting, Cuff Size: Normal)   Pulse 60   Ht 5\' 2"  (1.575 m)   Wt 156 lb (70.8 kg)   BMI 28.53 kg/m   General:  Well developed, well nourished, no acute distress Skin:  Warm and dry Neck:  Midline trachea, normal thyroid, good ROM, no lymphadenopathy Lungs; Clear to auscultation bilaterally Breast:  No dominant palpable mass, retraction, or nipple discharge Cardiovascular: Regular rate and rhythm Abdomen:  Soft, non tender, no hepatosplenomegaly Pelvic:  External genitalia is normal in appearance, no lesions.  The vagina is normal in appearance. Urethra has no lesions or masses. The cervix is smooth,pap with HR HPV genotyping performed.  Uterus is felt to be normal size, shape, and contour.  No adnexal masses or tenderness noted.Bladder is non tender, no masses felt. Rectal: Good sphincter tone, no polyps, or hemorrhoids felt.  Hemoccult negative. Extremities/musculoskeletal:  No swelling or varicosities noted, no clubbing or cyanosis Psych:  No mood changes, alert and cooperative,seems happy AA is 2 Fall risk is low Depression screen [facility] 2/9 04/20/2021 08/13/2019 07/09/2018  Decreased Interest 1 0 0  Down, Depressed, Hopeless 1 1 0  PHQ - 2 Score 2 1 0  Altered sleeping  1 - -  Tired, decreased energy 1 - -  Change in appetite 0 - -  Feeling bad or failure about yourself  1 - -  Trouble concentrating 0 - -  Moving slowly or fidgety/restless 0 - -  Suicidal thoughts 0 - -  PHQ-9 Score 5 - -    GAD 7 : Generalized Anxiety Score 04/20/2021  Nervous, Anxious, on Edge 1  Control/stop worrying 0  Worry too much - different things 0  Trouble relaxing 0  Restless 0  Easily annoyed or irritable 0  Afraid - awful might happen 0  Total GAD 7 Score 1      Upstream - 04/20/21 1141       Pregnancy Intention Screening   Does the patient want to become pregnant in the next year? No    Does the patient's partner want to become pregnant in the next year? No    Would the patient like to discuss contraceptive options today? No      Contraception Wrap Up   Current Method Oral Contraceptive    End Method Oral Contraceptive    Contraception Counseling Provided No            Examination chaperoned by 04/22/21 LPN   Impression and Plan: 1. Encounter for gynecological examination with Papanicolaou smear of cervix Pap sent Physical in 1 year Pap in 3 if normal Mammogram yearly Labs with PCP Colonoscopy advised  2. Encounter for surveillance of contraceptive pills Will refill micronor Meds ordered this encounter  Medications   norethindrone (NORA-BE) 0.35 MG tablet  Sig: Take 1 tablet (0.35 mg total) by mouth daily.    Dispense:  84 tablet    Refill:  4    Order Specific Question:   Supervising Provider    Answer:   Duane Lope H [[id number]]   levothyroxine (SYNTHROID) 100 MCG tablet    Sig: TAKE 1 TABLET BY MOUTH ONCE DAILY BEFORE BREAKFAST.    Dispense:  30 tablet    Refill:  12    Order Specific Question:   Supervising Provider    Answer:   Despina Hidden, LUTHER H [[id number]]     3. Hypothyroidism, unspecified type Will refill synthroid  4. Encounter for screening fecal occult blood testing

## 2021-04-24 LAB — CYTOLOGY - PAP
Adequacy: ABSENT
Chlamydia: NEGATIVE
Comment: NEGATIVE
Comment: NEGATIVE
Comment: NORMAL
Diagnosis: NEGATIVE
High risk HPV: NEGATIVE
Neisseria Gonorrhea: NEGATIVE

## 2022-02-19 ENCOUNTER — Encounter (INDEPENDENT_AMBULATORY_CARE_PROVIDER_SITE_OTHER): Payer: Self-pay | Admitting: *Deleted

## 2022-04-04 ENCOUNTER — Other Ambulatory Visit: Payer: Self-pay | Admitting: Adult Health

## 2022-07-30 ENCOUNTER — Other Ambulatory Visit (HOSPITAL_COMMUNITY): Payer: Self-pay | Admitting: Internal Medicine

## 2022-07-30 ENCOUNTER — Other Ambulatory Visit (HOSPITAL_COMMUNITY): Payer: Self-pay | Admitting: Adult Health

## 2022-07-30 DIAGNOSIS — Z1231 Encounter for screening mammogram for malignant neoplasm of breast: Secondary | ICD-10-CM

## 2022-07-31 ENCOUNTER — Ambulatory Visit (HOSPITAL_COMMUNITY)
Admission: RE | Admit: 2022-07-31 | Discharge: 2022-07-31 | Disposition: A | Discharge status: Home / Self Care | Payer: BC Managed Care – PPO | Source: Ambulatory Visit | Attending: Adult Health | Admitting: Adult Health

## 2022-07-31 DIAGNOSIS — Z1231 Encounter for screening mammogram for malignant neoplasm of breast: Secondary | ICD-10-CM | POA: Diagnosis not present

## 2022-08-06 ENCOUNTER — Encounter (INDEPENDENT_AMBULATORY_CARE_PROVIDER_SITE_OTHER): Payer: Self-pay | Admitting: *Deleted

## 2023-02-17 ENCOUNTER — Other Ambulatory Visit (HOSPITAL_COMMUNITY): Payer: Self-pay | Admitting: Internal Medicine

## 2023-02-17 DIAGNOSIS — E785 Hyperlipidemia, unspecified: Secondary | ICD-10-CM

## 2023-02-25 ENCOUNTER — Encounter (INDEPENDENT_AMBULATORY_CARE_PROVIDER_SITE_OTHER): Payer: Self-pay | Admitting: *Deleted

## 2023-04-15 ENCOUNTER — Ambulatory Visit (HOSPITAL_COMMUNITY): Admission: RE | Admit: 2023-04-15 | Payer: BC Managed Care – PPO | Source: Ambulatory Visit

## 2023-04-15 ENCOUNTER — Encounter (HOSPITAL_COMMUNITY): Payer: Self-pay

## 2023-08-07 ENCOUNTER — Encounter (INDEPENDENT_AMBULATORY_CARE_PROVIDER_SITE_OTHER): Payer: Self-pay | Admitting: *Deleted

## 2023-08-28 ENCOUNTER — Other Ambulatory Visit (HOSPITAL_COMMUNITY): Payer: Self-pay | Admitting: Family Medicine

## 2023-08-28 DIAGNOSIS — Z1231 Encounter for screening mammogram for malignant neoplasm of breast: Secondary | ICD-10-CM

## 2023-09-02 ENCOUNTER — Encounter (INDEPENDENT_AMBULATORY_CARE_PROVIDER_SITE_OTHER): Payer: Self-pay | Admitting: *Deleted

## 2023-09-12 ENCOUNTER — Ambulatory Visit (HOSPITAL_COMMUNITY)
Admission: RE | Admit: 2023-09-12 | Discharge: 2023-09-12 | Disposition: A | Discharge status: Home / Self Care | Payer: BC Managed Care – PPO | Source: Ambulatory Visit | Attending: Family Medicine | Admitting: Family Medicine

## 2023-09-12 ENCOUNTER — Encounter (HOSPITAL_COMMUNITY): Payer: Self-pay | Admitting: Radiology

## 2023-09-12 ENCOUNTER — Ambulatory Visit (HOSPITAL_COMMUNITY)
Admission: RE | Admit: 2023-09-12 | Discharge: 2023-09-12 | Disposition: A | Discharge status: Home / Self Care | Payer: BC Managed Care – PPO | Source: Ambulatory Visit | Attending: Internal Medicine | Admitting: Internal Medicine

## 2023-09-12 DIAGNOSIS — Z1231 Encounter for screening mammogram for malignant neoplasm of breast: Secondary | ICD-10-CM | POA: Diagnosis present

## 2023-09-12 DIAGNOSIS — E785 Hyperlipidemia, unspecified: Secondary | ICD-10-CM | POA: Insufficient documentation

## 2024-03-02 ENCOUNTER — Encounter (INDEPENDENT_AMBULATORY_CARE_PROVIDER_SITE_OTHER): Payer: Self-pay | Admitting: *Deleted

## 2024-03-09 ENCOUNTER — Encounter (INDEPENDENT_AMBULATORY_CARE_PROVIDER_SITE_OTHER): Payer: Self-pay | Admitting: *Deleted

## 2024-06-01 ENCOUNTER — Telehealth: Payer: Self-pay

## 2024-06-01 NOTE — Telephone Encounter (Signed)
 Who is your primary care physician: Dr.Zach Shona  Reasons for the colonoscopy: screening  Have you had a colonoscopy before?  no  Do you have family history of colon cancer? no  Previous colonoscopy with polyps removed? no  Do you have a history colorectal cancer?   no  Are you diabetic? If yes, Type 1 or Type 2?    no  Do you have a prosthetic or mechanical heart valve? no  Do you have a pacemaker/defibrillator?   no  Have you had endocarditis/atrial fibrillation? no  Have you had joint replacement within the last 12 months?  no  Do you tend to be constipated or have to use laxatives? no  Do you have any history of drugs or alchohol?  no  Do you use supplemental oxygen?  no  Have you had a stroke or heart attack within the last 6 months? no  Do you take weight loss medication?  no  For female patients: have you had a hysterectomy?  no                                     are you post menopausal?       no                                            do you still have your menstrual cycle? Several months since last one      Do you take any blood-thinning medications such as: (aspirin, warfarin, Plavix, Aggrenox)  no  If yes we need the name, milligram, dosage and who is prescribing doctor  Current Outpatient Medications on File Prior to Visit  Medication Sig Dispense Refill   levothyroxine  (SYNTHROID ) 100 MCG tablet TAKE 1 TABLET BY MOUTH ONCE DAILY BEFORE BREAKFAST. 30 tablet 6   losartan (COZAAR) 50 MG tablet Take 1 tablet by mouth daily.     No current facility-administered medications on file prior to visit.    Allergies  Allergen Reactions   Other Itching and Other (See Comments)    Steri strips-red streaks     Pharmacy: [facility] [place]  Primary Insurance Name: [facility] [employer] Health Plan [employer] [place] [id number]  Best number where you can be reached: [phone]

## 2024-06-01 NOTE — Telephone Encounter (Signed)
Ok to schedule.  Room : any   Thanks,  Vista Lawman, MD Gastroenterology and Hepatology [facility]

## 2024-06-02 NOTE — Telephone Encounter (Signed)
 Called pt, she will call back to schedule once she gets home from vacation.

## 2024-06-07 NOTE — Telephone Encounter (Signed)
 Pt is requesting to be scheduled in September. Advised pt will call once we get providers schedule. Pt verbalized understanding.

## 2024-06-11 NOTE — Telephone Encounter (Signed)
 LMOVM to return call.

## 2024-06-16 NOTE — Telephone Encounter (Signed)
 LMOVM to return call.

## 2024-06-18 ENCOUNTER — Encounter: Payer: Self-pay | Admitting: *Deleted

## 2024-06-18 NOTE — Telephone Encounter (Signed)
 Pt couldn't do the dates given to her for month of September. She will wait until October. Advised pt will call once we get schedule

## 2024-08-17 NOTE — Telephone Encounter (Signed)
 LMOVM to return call.

## 2024-08-19 ENCOUNTER — Encounter: Payer: Self-pay | Admitting: *Deleted

## 2024-08-19 ENCOUNTER — Other Ambulatory Visit: Payer: Self-pay | Admitting: *Deleted

## 2024-08-19 MED ORDER — PEG 3350-KCL-NA BICARB-NACL 420 G PO SOLR: 4000.0000 mL | Freq: Once | ORAL | 0 refills | Status: AC

## 2024-08-19 NOTE — Telephone Encounter (Signed)
 LMOVM to return call.. letter mailed

## 2024-08-31 ENCOUNTER — Other Ambulatory Visit (HOSPITAL_COMMUNITY): Payer: Self-pay | Admitting: Nurse Practitioner

## 2024-08-31 DIAGNOSIS — Z1231 Encounter for screening mammogram for malignant neoplasm of breast: Secondary | ICD-10-CM

## 2024-09-20 ENCOUNTER — Encounter (HOSPITAL_COMMUNITY)
Admission: RE | Admit: 2024-09-20 | Discharge: 2024-09-20 | Disposition: A | Discharge status: Home / Self Care | Source: Ambulatory Visit | Attending: Gastroenterology | Admitting: Gastroenterology

## 2024-09-20 ENCOUNTER — Encounter (HOSPITAL_COMMUNITY): Payer: Self-pay | Admitting: *Deleted

## 2024-09-20 HISTORY — DX: Hypothyroidism, unspecified: E03.9

## 2024-09-22 NOTE — Anesthesia Preprocedure Evaluation (Signed)
 Anesthesia Evaluation  Patient identified by MRN, date of birth, ID band Patient awake    Reviewed: Allergy & Precautions, H&P , NPO status , Patient's Chart, lab work & pertinent test results, reviewed documented beta blocker date and time   Airway Mallampati: II  TM Distance: >3 FB Neck ROM: full    Dental no notable dental hx. (+) Dental Advisory Given, Teeth Intact   Pulmonary neg pulmonary ROS   Pulmonary exam normal breath sounds clear to auscultation       Cardiovascular Exercise Tolerance: Good negative cardio ROS Normal cardiovascular exam Rhythm:regular Rate:Normal     Neuro/Psych   Anxiety     negative neurological ROS     GI/Hepatic negative GI ROS, Neg liver ROS,,,  Endo/Other  Hypothyroidism    Renal/GU negative Renal ROS  negative genitourinary   Musculoskeletal   Abdominal   Peds  Hematology negative hematology ROS (+)   Anesthesia Other Findings   Reproductive/Obstetrics negative OB ROS                              Anesthesia Physical Anesthesia Plan  ASA: 2  Anesthesia Plan: General   Post-op Pain Management: Minimal or no pain anticipated   Induction: Intravenous  PONV Risk Score and Plan: Propofol infusion  Airway Management Planned: Natural Airway and Nasal Cannula  Additional Equipment: None  Intra-op Plan:   Post-operative Plan:   Informed Consent: I have reviewed the patients History and Physical, chart, labs and discussed the procedure including the risks, benefits and alternatives for the proposed anesthesia with the patient or authorized representative who has indicated his/her understanding and acceptance.     Dental Advisory Given  Plan Discussed with: CRNA  Anesthesia Plan Comments:          Anesthesia Quick Evaluation

## 2024-09-23 ENCOUNTER — Encounter (HOSPITAL_COMMUNITY): Admission: RE | Disposition: A | Payer: Self-pay | Source: Home / Self Care | Attending: Gastroenterology

## 2024-09-23 ENCOUNTER — Encounter (HOSPITAL_COMMUNITY): Payer: Self-pay | Admitting: Anesthesiology

## 2024-09-23 ENCOUNTER — Encounter (INDEPENDENT_AMBULATORY_CARE_PROVIDER_SITE_OTHER): Payer: Self-pay | Admitting: *Deleted

## 2024-09-23 ENCOUNTER — Encounter (HOSPITAL_COMMUNITY): Payer: Self-pay | Admitting: Gastroenterology

## 2024-09-23 ENCOUNTER — Ambulatory Visit (HOSPITAL_COMMUNITY): Payer: Self-pay | Admitting: Anesthesiology

## 2024-09-23 ENCOUNTER — Ambulatory Visit (HOSPITAL_COMMUNITY)
Admission: RE | Admit: 2024-09-23 | Discharge: 2024-09-23 | Disposition: A | Discharge status: Home / Self Care | Attending: Gastroenterology | Admitting: Gastroenterology

## 2024-09-23 ENCOUNTER — Other Ambulatory Visit: Payer: Self-pay

## 2024-09-23 DIAGNOSIS — K648 Other hemorrhoids: Secondary | ICD-10-CM

## 2024-09-23 DIAGNOSIS — Z1211 Encounter for screening for malignant neoplasm of colon: Secondary | ICD-10-CM | POA: Insufficient documentation

## 2024-09-23 DIAGNOSIS — K5730 Diverticulosis of large intestine without perforation or abscess without bleeding: Secondary | ICD-10-CM | POA: Insufficient documentation

## 2024-09-23 DIAGNOSIS — D125 Benign neoplasm of sigmoid colon: Secondary | ICD-10-CM | POA: Insufficient documentation

## 2024-09-23 DIAGNOSIS — E039 Hypothyroidism, unspecified: Secondary | ICD-10-CM | POA: Insufficient documentation

## 2024-09-23 DIAGNOSIS — K6389 Other specified diseases of intestine: Secondary | ICD-10-CM

## 2024-09-23 HISTORY — PX: COLONOSCOPY: SHX5424

## 2024-09-23 LAB — HM COLONOSCOPY

## 2024-09-23 SURGERY — COLONOSCOPY
Anesthesia: General

## 2024-09-23 MED ORDER — PHENYLEPHRINE 80 MCG/ML (10ML) SYRINGE FOR IV PUSH (FOR BLOOD PRESSURE SUPPORT)
PREFILLED_SYRINGE | INTRAVENOUS | Status: DC | PRN
  Administered 2024-09-23: 160 ug via INTRAVENOUS

## 2024-09-23 MED ORDER — PROPOFOL 10 MG/ML IV BOLUS
INTRAVENOUS | Status: DC | PRN
  Administered 2024-09-23: 70 mg via INTRAVENOUS
  Administered 2024-09-23: 125 ug/kg/min via INTRAVENOUS
  Administered 2024-09-23: 30 mg via INTRAVENOUS

## 2024-09-23 MED ORDER — LACTATED RINGERS IV SOLN: INTRAVENOUS | Status: DC | PRN

## 2024-09-23 MED ORDER — LACTATED RINGERS IV SOLN: INTRAVENOUS | Status: DC

## 2024-09-23 MED ORDER — LIDOCAINE 2% (20 MG/ML) 5 ML SYRINGE
INTRAMUSCULAR | Status: DC | PRN
  Administered 2024-09-23: 60 mg via INTRAVENOUS
  Administered 2024-09-23: 40 mg via INTRAVENOUS

## 2024-09-23 NOTE — Anesthesia Postprocedure Evaluation (Signed)
 Anesthesia Post Note  Patient: Tami White  Procedure(s) Performed: COLONOSCOPY  Patient location during evaluation: Endoscopy Anesthesia Type: General Level of consciousness: awake and alert Pain management: pain level controlled Vital Signs Assessment: post-procedure vital signs reviewed and stable Respiratory status: spontaneous breathing, nonlabored ventilation and respiratory function stable Cardiovascular status: stable Anesthetic complications: no   There were no known notable events for this encounter.   Last Vitals:  Vitals:   09/23/24 0722 09/23/24 0918  BP: (!) 128/47 (!) 111/50  Pulse: 70 81  Resp: 18 (!) 25  Temp: 36.8 C 36.6 C  SpO2: 98% 100%    Last Pain:  Vitals:   09/23/24 0920  TempSrc:   PainSc: 0-No pain                 Arsalan Brisbin L Ryelee Albee

## 2024-09-23 NOTE — H&P (Signed)
 Primary Care Physician:  Shona Norleen PEDLAR, MD Primary Gastroenterologist:  Dr. Cinderella  Pre-Procedure History & Physical: HPI:  Tami White is a 53 y.o. female is here for a colonoscopy for colon cancer screening purposes.  Patient denies any family history of colorectal cancer.  No melena or hematochezia.  No abdominal pain or unintentional weight loss.  No change in bowel habits.  Overall feels well from a GI standpoint.  Past Medical History:  Diagnosis Date   Anxiety    Contraceptive management 11/23/2013   Hemorrhoids    Hypothyroidism    Sinus infection 11/23/2013   Thyroid disease    Hypothyroidism    Past Surgical History:  Procedure Laterality Date   CESAREAN SECTION      Prior to Admission medications   Medication Sig Start Date End Date Taking? Authorizing Provider  levothyroxine  (SYNTHROID ) 100 MCG tablet TAKE 1 TABLET BY MOUTH ONCE DAILY BEFORE BREAKFAST. 04/05/22  Yes Signa Nest A, NP  losartan (COZAAR) 50 MG tablet Take 1 tablet by mouth daily. 03/13/21  Yes [provider]    Allergies as of 08/19/2024 - Review Complete 06/01/2024  Allergen Reaction Noted   Other Itching and Other (See Comments) 04/20/2021    Family History  Problem Relation Age of Onset   Hypertension Mother    Hyperlipidemia Mother    Hyperlipidemia Father    Hypertension Father    Arthritis Father    Cancer Father 60       merckle cell (skin cancer)   Breast cancer Paternal Aunt    Cancer Paternal Aunt        breast    Arthritis Paternal Aunt    Heart attack Paternal Uncle    Asthma Maternal Grandmother    COPD Maternal Grandmother    Diabetes Maternal Grandfather    Cancer Maternal Grandfather        skin    Heart disease Maternal Grandfather    Kidney disease Paternal Grandmother    Hypertension Paternal Grandfather    Stroke Paternal Grandfather     Social History   Socioeconomic History   Marital status: Married    Spouse name: Not on file   Number of  children: Not on file   Years of education: Not on file   Highest education level: Not on file  Occupational History   Not on file  Tobacco Use   Smoking status: Never   Smokeless tobacco: Never  Vaping Use   Vaping status: Never Used  Substance and Sexual Activity   Alcohol use: Yes    Comment: wine here and there   Drug use: No   Sexual activity: Yes    Birth control/protection: Pill  Other Topics Concern   Not on file  Social History Narrative   Not on file   Social Drivers of Health   Financial Resource Strain: Low Risk  (04/20/2021)   Overall Financial Resource Strain (CARDIA)    Difficulty of Paying Living Expenses: Not very hard  Food Insecurity: No Food Insecurity (04/20/2021)   Hunger Vital Sign    Worried About Running Out of Food in the Last Year: Never true    Ran Out of Food in the Last Year: Never true  Transportation Needs: No Transportation Needs (04/20/2021)   PRAPARE - Administrator, Civil Service (Medical): No    Lack of Transportation (Non-Medical): No  Physical Activity: Inactive (04/20/2021)   Exercise Vital Sign    Days of Exercise per Week: 0  days    Minutes of Exercise per Session: 0 min  Stress: Stress Concern Present (04/20/2021)   [employer] of Occupational Health - Occupational Stress Questionnaire    Feeling of Stress : To some extent  Social Connections: Socially Integrated (04/20/2021)   Social Connection and Isolation Panel    Frequency of Communication with Friends and Family: More than three times a week    Frequency of Social Gatherings with Friends and Family: More than three times a week    Attends Religious Services: More than 4 times per year    Active Member of [employer] or Organizations: Yes    Attends Banker Meetings: More than 4 times per year    Marital Status: Married  Catering Manager Violence: Not At Risk (04/20/2021)   Humiliation, Afraid, Rape, and Kick questionnaire    Fear of Current or  Ex-Partner: No    Emotionally Abused: No    Physically Abused: No    Sexually Abused: No    Review of Systems: See HPI, otherwise negative ROS  Physical Exam: Vital signs in last 24 hours: Temp:  [98.3 F (36.8 C)] 98.3 F (36.8 C) (11/20 0722) Pulse Rate:  [70] 70 (11/20 0722) Resp:  [18] 18 (11/20 0722) BP: (128)/(47) 128/47 (11/20 0722) SpO2:  [98 %] 98 % (11/20 0722)   General:   Alert,  Well-developed, well-nourished, pleasant and cooperative in NAD Head:  Normocephalic and atraumatic. Eyes:  Sclera clear, no icterus.   Conjunctiva pink. Ears:  Normal auditory acuity. Nose:  No deformity, discharge,  or lesions. Msk:  Symmetrical without gross deformities. Normal posture. Extremities:  Without clubbing or edema. Neurologic:  Alert and  oriented x4;  grossly normal neurologically. Skin:  Intact without significant lesions or rashes. Psych:  Alert and cooperative. Normal mood and affect.  Impression/Plan: Tami White is here for a colonoscopy to be performed for colon cancer screening purposes.  The risks of the procedure including infection, bleed, or perforation as well as benefits, limitations, alternatives and imponderables have been reviewed with the patient. Questions have been answered. All parties agreeable.

## 2024-09-23 NOTE — Anesthesia Procedure Notes (Signed)
 Date/Time: 09/23/2024 8:47 AM  Performed by: Para Jerelene CROME, CRNAOxygen Delivery Method: Nasal cannula

## 2024-09-23 NOTE — Op Note (Signed)
 [facility] Patient Name: Tami White Procedure Date: 09/23/2024 8:37 AM MRN: [id number] Date of Birth: 03-02-1971 Attending MD: Deatrice Dine , MD, [id number] CSN: [id number] Age: 53 Admit Type: Outpatient Procedure:                Colonoscopy Indications:              Screening for colorectal malignant neoplasm Providers:                Deatrice Dine, MD, Jon LABOR. Gerome RN, RN,                            Dorcas Lenis, Technician Referring MD:              Medicines:                Monitored Anesthesia Care Complications:            No immediate complications. Estimated Blood Loss:     Estimated blood loss was minimal. Procedure:                Pre-Anesthesia Assessment:                           - Prior to the procedure, a History and Physical                            was performed, and patient medications and                            allergies were reviewed. The patient's tolerance of                            previous anesthesia was also reviewed. The risks                            and benefits of the procedure and the sedation                            options and risks were discussed with the patient.                            All questions were answered, and informed consent                            was obtained. Prior Anticoagulants: The patient has                            taken no anticoagulant or antiplatelet agents. ASA                            Grade Assessment: II - A patient with mild systemic                            disease. After reviewing the risks and benefits,  the patient was deemed in satisfactory condition to                            undergo the procedure.                           After obtaining informed consent, the colonoscope                            was passed under direct vision. Throughout the                            procedure, the patient's blood pressure, pulse, and                             oxygen saturations were monitored continuously. The                            [id number] ([id number]) Colon was introduced through                            the anus and advanced to the the cecum, identified                            by appendiceal orifice and ileocecal valve. The                            colonoscopy was performed without difficulty. The                            patient tolerated the procedure well. The quality                            of the bowel preparation was evaluated using the                            BBPS [place] Bowel Preparation Scale) with scores                            of: Right Colon = 3, Transverse Colon = 3 and Left                            Colon = 3 (entire mucosa seen well with no residual                            staining, small fragments of stool or opaque                            liquid). The total BBPS score equals 9. The                            ileocecal valve, appendiceal orifice, and rectum  were photographed. Scope In: 8:52:19 AM Scope Out: 9:12:12 AM Total Procedure Duration: 0 hours 19 minutes 53 seconds  Findings:      The perianal and digital rectal examinations were normal.      A 4 mm polyp was found in the sigmoid colon. The polyp was sessile. The       polyp was removed with a cold snare. Resection and retrieval were       complete.      Scattered small-mouthed diverticula were found in the entire colon.      An area of nodular mucosa was found in the proximal sigmoid colon. This       was a polypoid lesion 5mm, NBI without any mucosal change, appears to be       inverted diveticuli ; not removed      Non-bleeding internal hemorrhoids were found during retroflexion. The       hemorrhoids were small. Impression:               - One 4 mm polyp in the sigmoid colon, removed with                            a cold snare. Resected and retrieved.                           - Diverticulosis in the  entire examined colon.                           - Nodular mucosa in the proximal sigmoid colon.                           - Non-bleeding internal hemorrhoids. Moderate Sedation:      Per Anesthesia Care Recommendation:           - Patient has a contact number available for                            emergencies. The signs and symptoms of potential                            delayed complications were discussed with the                            patient. Return to normal activities tomorrow.                            Written discharge instructions were provided to the                            patient.                           - Resume previous diet.                           - Continue present medications.                           - Await pathology results.                           -  Repeat colonoscopy in 3 - 5 years for                            surveillance based on pathology results. Early                            colonoscopy discussed with patient to assess                            stability of the distal sigmoid lesion which                            appears to be polypoid inverted diverticuli                           - Return to primary care physician as previously                            scheduled. Procedure Code(s):        --- Professional ---                           [phone], Colonoscopy, flexible; with removal of                            tumor(s), polyp(s), or other lesion(s) by snare                            technique Diagnosis Code(s):        --- Professional ---                           Z12.11, Encounter for screening for malignant                            neoplasm of colon                           D12.5, Benign neoplasm of sigmoid colon                           K64.8, Other hemorrhoids                           K63.89, Other specified diseases of intestine                           K57.30, Diverticulosis of large intestine without                             perforation or abscess without bleeding CPT copyright 2022 American Medical Association. All rights reserved. The codes documented in this report are preliminary and upon coder review may  be revised to meet current compliance requirements. Deatrice Dine, MD Deatrice Dine, MD 09/23/2024 9:26:16 AM This report has been signed electronically. Number of Addenda: 0

## 2024-09-23 NOTE — Discharge Instructions (Signed)

## 2024-09-23 NOTE — Transfer of Care (Addendum)
 Immediate Anesthesia Transfer of Care Note  Patient: Tami White  Procedure(s) Performed: COLONOSCOPY  Patient Location: Endoscopy Unit  Anesthesia Type:General  Level of Consciousness: drowsy and patient cooperative  Airway & Oxygen Therapy: Patient Spontanous Breathing and Patient connected to nasal cannula oxygen  Post-op Assessment: Report given to RN and Post -op Vital signs reviewed and stable  Post vital signs: Reviewed and stable  Last Vitals:  Vitals Value Taken Time  BP 111/50 09/23/24   0918  Temp 36.6 09/23/24   0918  Pulse 81 09/23/24   0918  Resp 25 09/23/24   0918  SpO2 100% 09/23/24   0918    Last Pain:  Vitals:   09/23/24 0846  TempSrc:   PainSc: 0-No pain         Complications: No notable events documented.

## 2024-09-24 ENCOUNTER — Encounter (HOSPITAL_COMMUNITY): Payer: Self-pay | Admitting: Gastroenterology

## 2024-09-24 LAB — SURGICAL PATHOLOGY

## 2024-09-29 ENCOUNTER — Ambulatory Visit (HOSPITAL_COMMUNITY)
Admission: RE | Admit: 2024-09-29 | Discharge: 2024-09-29 | Disposition: A | Discharge status: Home / Self Care | Source: Ambulatory Visit | Attending: Nurse Practitioner | Admitting: Nurse Practitioner

## 2024-09-29 ENCOUNTER — Encounter (HOSPITAL_COMMUNITY): Payer: Self-pay

## 2024-09-29 DIAGNOSIS — Z1231 Encounter for screening mammogram for malignant neoplasm of breast: Secondary | ICD-10-CM | POA: Insufficient documentation

## 2024-10-02 ENCOUNTER — Ambulatory Visit (INDEPENDENT_AMBULATORY_CARE_PROVIDER_SITE_OTHER): Payer: Self-pay | Admitting: Gastroenterology

## 2024-10-04 NOTE — Progress Notes (Signed)
 3 yr TCS noted in recall Patient result letter mailed procedure note and pathology result faxed to PCP

## 2024-10-07 ENCOUNTER — Other Ambulatory Visit (HOSPITAL_COMMUNITY): Payer: Self-pay | Admitting: Nurse Practitioner

## 2024-10-07 DIAGNOSIS — R928 Other abnormal and inconclusive findings on diagnostic imaging of breast: Secondary | ICD-10-CM

## 2024-10-12 ENCOUNTER — Ambulatory Visit (HOSPITAL_COMMUNITY)
Admission: RE | Admit: 2024-10-12 | Discharge: 2024-10-12 | Attending: Nurse Practitioner | Admitting: Nurse Practitioner

## 2024-10-12 ENCOUNTER — Other Ambulatory Visit (HOSPITAL_COMMUNITY): Payer: Self-pay | Admitting: Nurse Practitioner

## 2024-10-12 DIAGNOSIS — R928 Other abnormal and inconclusive findings on diagnostic imaging of breast: Secondary | ICD-10-CM

## 2024-10-25 ENCOUNTER — Other Ambulatory Visit (HOSPITAL_COMMUNITY): Payer: Self-pay | Admitting: Nurse Practitioner

## 2024-10-25 DIAGNOSIS — R928 Other abnormal and inconclusive findings on diagnostic imaging of breast: Secondary | ICD-10-CM

## 2024-10-26 ENCOUNTER — Encounter (HOSPITAL_COMMUNITY): Payer: Self-pay

## 2024-10-26 ENCOUNTER — Ambulatory Visit (HOSPITAL_COMMUNITY)
Admission: RE | Admit: 2024-10-26 | Discharge: 2024-10-26 | Disposition: A | Discharge status: Home / Self Care | Source: Ambulatory Visit | Attending: Nurse Practitioner | Admitting: Nurse Practitioner

## 2024-10-26 DIAGNOSIS — N6313 Unspecified lump in the right breast, lower outer quadrant: Secondary | ICD-10-CM | POA: Insufficient documentation

## 2024-10-26 DIAGNOSIS — R928 Other abnormal and inconclusive findings on diagnostic imaging of breast: Secondary | ICD-10-CM | POA: Insufficient documentation

## 2024-10-26 HISTORY — PX: BREAST BIOPSY: SHX20

## 2024-10-26 MED ORDER — LIDOCAINE HCL (PF) 2 % IJ SOLN
10.0000 mL | Freq: Once | INTRAMUSCULAR | Status: AC
  Administered 2024-10-26: 10 mL

## 2024-10-26 MED ORDER — LIDOCAINE-EPINEPHRINE (PF) 1 %-1:200000 IJ SOLN
INTRAMUSCULAR | Status: AC
  Filled 2024-10-26: qty 30

## 2024-10-26 MED ORDER — LIDOCAINE-EPINEPHRINE (PF) 1 %-1:200000 IJ SOLN
10.0000 mL | Freq: Once | INTRAMUSCULAR | Status: AC
  Administered 2024-10-26: 10 mL via INTRADERMAL

## 2024-10-26 MED ORDER — LIDOCAINE HCL (PF) 2 % IJ SOLN
INTRAMUSCULAR | Status: AC
  Filled 2024-10-26: qty 10

## 2024-10-26 NOTE — Progress Notes (Signed)
 PT tolerated right breast biopsy well today with NAD noted. PT verbalized understanding of discharge instructions and given ice packs to use. PT ambulated back to the mammogram area this time. Specimens taken to lab by Colette from ultrasound for processing.

## 2024-10-27 LAB — SURGICAL PATHOLOGY
